# Patient Record
Sex: Female | Born: 1955 | Race: White | Hispanic: No | Marital: Married | Smoking: Never smoker
Health system: Southern US, Community
[De-identification: ages and names within clinical notes are randomized; demographics above are authoritative.]

## PROBLEM LIST (undated history)

## (undated) DIAGNOSIS — M2240 Chondromalacia patellae, unspecified knee: Secondary | ICD-10-CM

## (undated) DIAGNOSIS — R112 Nausea with vomiting, unspecified: Secondary | ICD-10-CM

## (undated) DIAGNOSIS — R51 Headache: Secondary | ICD-10-CM

## (undated) DIAGNOSIS — J189 Pneumonia, unspecified organism: Secondary | ICD-10-CM

## (undated) DIAGNOSIS — Z9889 Other specified postprocedural states: Secondary | ICD-10-CM

## (undated) DIAGNOSIS — M722 Plantar fascial fibromatosis: Secondary | ICD-10-CM

## (undated) DIAGNOSIS — C801 Malignant (primary) neoplasm, unspecified: Secondary | ICD-10-CM

## (undated) DIAGNOSIS — I499 Cardiac arrhythmia, unspecified: Secondary | ICD-10-CM

## (undated) HISTORY — DX: Headache: R51

## (undated) HISTORY — PX: BASAL CELL CARCINOMA EXCISION: SHX1214

## (undated) HISTORY — DX: Plantar fascial fibromatosis: M72.2

## (undated) HISTORY — DX: Chondromalacia patellae, unspecified knee: M22.40

## (undated) HISTORY — PX: TONSILLECTOMY: SUR1361

---

## 1997-12-10 ENCOUNTER — Other Ambulatory Visit: Admission: RE | Admit: 1997-12-10 | Discharge: 1997-12-10 | Payer: Self-pay | Admitting: Obstetrics and Gynecology

## 1999-01-30 ENCOUNTER — Other Ambulatory Visit: Admission: RE | Admit: 1999-01-30 | Discharge: 1999-01-30 | Payer: Self-pay | Admitting: Obstetrics and Gynecology

## 2000-02-10 ENCOUNTER — Observation Stay (HOSPITAL_COMMUNITY): Admission: AD | Admit: 2000-02-10 | Discharge: 2000-02-11 | Payer: Self-pay | Admitting: Cardiology

## 2000-02-10 ENCOUNTER — Encounter: Payer: Self-pay | Admitting: Cardiology

## 2000-02-10 ENCOUNTER — Emergency Department (HOSPITAL_COMMUNITY): Admission: EM | Admit: 2000-02-10 | Discharge: 2000-02-10 | Payer: Self-pay | Admitting: Emergency Medicine

## 2000-02-11 ENCOUNTER — Encounter: Payer: Self-pay | Admitting: Cardiology

## 2000-02-13 ENCOUNTER — Encounter: Payer: Self-pay | Admitting: Cardiology

## 2000-02-13 ENCOUNTER — Encounter: Admission: RE | Admit: 2000-02-13 | Discharge: 2000-02-13 | Payer: Self-pay | Admitting: Cardiology

## 2000-02-19 ENCOUNTER — Encounter: Payer: Self-pay | Admitting: Internal Medicine

## 2000-02-19 ENCOUNTER — Ambulatory Visit (HOSPITAL_COMMUNITY): Admission: RE | Admit: 2000-02-19 | Discharge: 2000-02-19 | Payer: Self-pay | Admitting: Internal Medicine

## 2000-02-25 ENCOUNTER — Ambulatory Visit (HOSPITAL_COMMUNITY): Admission: RE | Admit: 2000-02-25 | Discharge: 2000-02-25 | Payer: Self-pay | Admitting: Internal Medicine

## 2000-03-15 ENCOUNTER — Other Ambulatory Visit: Admission: RE | Admit: 2000-03-15 | Discharge: 2000-03-15 | Payer: Self-pay | Admitting: Obstetrics and Gynecology

## 2000-06-18 ENCOUNTER — Encounter: Payer: Self-pay | Admitting: Internal Medicine

## 2000-06-18 ENCOUNTER — Encounter: Admission: RE | Admit: 2000-06-18 | Discharge: 2000-06-18 | Payer: Self-pay | Admitting: Internal Medicine

## 2001-01-27 ENCOUNTER — Encounter: Admission: RE | Admit: 2001-01-27 | Discharge: 2001-01-27 | Payer: Self-pay | Admitting: Gastroenterology

## 2001-01-27 ENCOUNTER — Encounter: Payer: Self-pay | Admitting: Gastroenterology

## 2001-04-01 ENCOUNTER — Other Ambulatory Visit: Admission: RE | Admit: 2001-04-01 | Discharge: 2001-04-01 | Payer: Self-pay | Admitting: Obstetrics and Gynecology

## 2002-06-02 ENCOUNTER — Other Ambulatory Visit: Admission: RE | Admit: 2002-06-02 | Discharge: 2002-06-02 | Payer: Self-pay | Admitting: Obstetrics and Gynecology

## 2003-06-22 ENCOUNTER — Other Ambulatory Visit: Admission: RE | Admit: 2003-06-22 | Discharge: 2003-06-22 | Payer: Self-pay | Admitting: Obstetrics and Gynecology

## 2003-12-04 ENCOUNTER — Other Ambulatory Visit: Admission: RE | Admit: 2003-12-04 | Discharge: 2003-12-04 | Payer: Self-pay | Admitting: Obstetrics and Gynecology

## 2004-07-15 ENCOUNTER — Other Ambulatory Visit: Admission: RE | Admit: 2004-07-15 | Discharge: 2004-07-15 | Payer: Self-pay | Admitting: Obstetrics and Gynecology

## 2005-02-06 ENCOUNTER — Other Ambulatory Visit: Admission: RE | Admit: 2005-02-06 | Discharge: 2005-02-06 | Payer: Self-pay | Admitting: Obstetrics and Gynecology

## 2005-08-03 ENCOUNTER — Ambulatory Visit: Payer: Self-pay | Admitting: Cardiology

## 2005-08-21 ENCOUNTER — Other Ambulatory Visit: Admission: RE | Admit: 2005-08-21 | Discharge: 2005-08-21 | Payer: Self-pay | Admitting: Obstetrics and Gynecology

## 2006-08-20 ENCOUNTER — Ambulatory Visit: Payer: Self-pay | Admitting: Cardiology

## 2006-09-29 ENCOUNTER — Ambulatory Visit (HOSPITAL_COMMUNITY): Admission: RE | Admit: 2006-09-29 | Discharge: 2006-09-29 | Payer: Self-pay | Admitting: Obstetrics and Gynecology

## 2007-11-02 ENCOUNTER — Ambulatory Visit: Payer: Self-pay | Admitting: Cardiology

## 2010-10-07 NOTE — Assessment & Plan Note (Signed)
[facility]                            CARDIOLOGY OFFICE NOTE   NAME:[name], [name] HOSTERMAN                         MRN:          [id number]  DATE:11/02/2007                            DOB:          03-07-1956    PRIMARY CARE PHYSICIAN:  [name] [name], M.D.   REASON FOR VISIT:  Follow up palpitations.   HISTORY OF PRESENT ILLNESS:  [name] [name] comes in for a 1-year visit.  She  has a history of palpitations and possible paroxysmal supraventricular  tachycardia versus inappropriate sinus tachycardia that was evaluated in  the past.  She has been very stable on beta blocker therapy without any  prolonged rapid palpitations.  She does have occasional palpitations but  they do not trouble her.  Her electrocardiogram today is normal, showing  sinus rhythm at 79 beats per minute with normal intervals.  She actually  cut her Toprol XL back from 50 mg a day to 25 mg a day and has tolerated  this intervention.  She reports some weight gain and has had some knee  discomfort which has limited her activities, although she is still able  walk three miles a day.   ALLERGIES:  AMPICILLIN.   MEDICATIONS:  Toprol XL 25 mg p.o. daily.   REVIEW OF SYSTEMS:  As described in the history of present illness.  Otherwise negative.   EXAMINATION:  Blood pressure 104/64, heart rate is 80.  Weight is 162  pounds.  The patient is comfortable, in no acute distress.  NECK:  No elevated jugular venous pressure, no loud bruits.  No  thyromegaly.  LUNGS:  Clear without labored breathing.  CARDIAC:  A regular rate and rhythm.  No loud murmur or gallop.  EXTREMITIES:  No pitting edema.   IMPRESSION AND RECOMMENDATIONS:  History of palpitations and possible  paroxysmal supraventricular tachycardia.  [name] [name] has been very stable  on medical therapy and at this point we will make cardiology follow-up  on an as-needed basis.  She will plan to continue to see [name] [name], who  can  assume her Toprol XL prescription, which I would keep at 25 mg daily  for now.  Clearly, if she develops progressive symptoms I would suggest  an event recorder to document her arrhythmia and then referral back  perhaps to our electrophysiology team to discuss additional options.     [name] Sidle, MD  Electronically Signed   SGM/MedQ  DD: 11/02/2007  DT: 11/02/2007  Job #: [id number]   cc:   [name] [name], M.D.

## 2010-10-10 NOTE — Discharge Summary (Signed)
[place]. [facility]  Patient:    [name] [name], [name] [name]                           MRN: [id number] Adm. Date:  [id number] Disc. Date: 02/11/00 Attending:  Learta Codding Dictator:   Delton See, P.A. CC:         Lewayne Bunting, M.D.                           Discharge Summary  DATE OF BIRTH:  10-15-1955  HISTORY OF PRESENT ILLNESS:  Ms. Kuhar is a 55 year old female with no significant past medical history.  She was seen in the emergency room at [facility] at approximately 1 a.m. when she presented with sudden onset of left-sided arm pain and palpitations.  She reported sudden onset of the palpitations and presyncope at approximately 1 a.m.  An EKG was obtained which showed no evidence of an MI, however, the patient reported that her heart rate was occasionally in the 150 range and she nearly had a syncopal episode in the emergency room.  She was given IV fluids and eventually discharged with arrangements made to follow up in the office with Dr. Andee Lineman.  PAST MEDICAL HISTORY:  The patient had a basal cell carcinoma three years ago. She has had vaginal deliveries in 1985 and 1983.  She had a tonsillectomy 7 years ago.  ALLERGIES:  AMPICILLIN, CODEINE.  SOCIAL HISTORY:  The patient does not smoke.  She does not use alcohol.  She is currently in grad school.  She used to be a Runner, broadcasting/film/video.  She does aerobics and exercises regularly.  She is married and has two children who are healthy.  FAMILY HISTORY:  Her father had coronary artery bypass graft surgery in his 48s.  Her mother is age 81 and is alive and healthy.  She has one brother at age 42 who is healthy.  HOSPITAL COURSE:  As noted, this patient was seen in the emergency room with palpitations and persyncope.  She was later seen in the office by Dr. Andee Lineman and arrangements were made to admit the patient for further evaluation. Cardiac enzymes were performed.  The patient ruled out for an MI.   Cardiac enzymes were negative and a D-dimer was also negative.  The patient was scheduled for an Adenosine Cardiolite which was performed on February 11, 2000.  This was negative for ischemia and showed an ejection fraction greater than 60%.  During the Adenosine Cardiolite, the patient became very anxious and had a sinus tachycardia in the 140 range.  Her rate returned to the 90 range following the study.  During the patients stay, she was treated with metoprolol 12.5 mg b.i.d. and she was on aspirin.  Upon further discussion with Dr. Andee Lineman a decision was made to send her out on Toprol XL but no aspirin.  Initially she was to go home on Toprol XL 50 mg daily, however, her blood pressure was approximately 90 at the time of discharge and her dosage was reduced to 25 mg daily.  She was discharged in improved condition on February 11, 2000.  DISCHARGE MEDICATIONS:  Toprol XL 25 mg daily.  ACTIVITY:  As tolerated.  She was to resume the same diet she was on before. It was felt that she might need an event monitor if she continued to have symptomatic palpitations or tachycardia.  She was told to call the office in this event and an event monitor could be scheduled.  She would see Dr. Andee Lineman in the office on October 12, at 12 noon.  She does not have a primary physician.  It was recommended that she find a family physician.  She was told she could follow with [facility] and she was told to call for an appointment if this was her decision.  DISCHARGE DIAGNOSES: 1. Tachycardia with left arm pain, uncertain etiology. 2. Adenosine Cardiolite negative for ischemia, ejection fraction greater    than 60%. 3. Anxiety. 4. Sinus tachycardia, rates in the 140 range. 5. Mild asymptomatic hypotension. 6. History of basal cell carcinoma. 7. History of vaginal deliveries x 2. 8. Status post tonsillectomy. 9. Intolerant to ampicillin and Codeine. DD:  02/11/00 TD:  02/11/00 Job:  [id number]

## 2010-10-10 NOTE — H&P (Signed)
[place]. [facility]  Patient:    [name] [name], [name] [name]                           MRN: [id number] Adm. Date:  [id number] Attending:  Learta Codding CC:         Trudi Ida. Denton Lank, M.D.   History and Physical  REASON FOR ADMISSION:  Substernal chest pain and left sided arm pain associated with palpitations and presyncopal.  HISTORY OF PRESENT ILLNESS:  The patient is a 55 year old white female with no significant past medical history. She was seen in the emergency room at 1:00 this morning after she presented with the sudden onset of left sided arm pain and palpitations. The patient reported the sudden onset of palpitations and presyncopal at 1:00 this morning. She also felt some mild shortness of breath. She felt a numbness radiating down the left arm. She also noticed her heart rate was very fast, although she could not tell us exactly how fast. She then presented to the emergency room where she was placed on telemetry and a 12 lead electrocardiogram was obtained. There was no evidence of myocardial injury. However, the patient reports that one one occasion her heart rate went up in the 150s and she nearly blacked out in the emergency room. At that time they placed intravenous, but it is not entirely clear what else was done. I do have an electrocardiogram from the emergency room which shows sinus tachycardia, but no other acute changes. The patient, in addition to palpitations, reported some left shoulder and left jaw pain.  She has no prior history of syncope of palpitations. She has been in her usual state of health and she has a good exercise tolerance otherwise. She is NYH Class 1. She denied any recent fevers or chills.  PAST MEDICAL HISTORY: 1. Basal cell carcinoma three years ago. 2. Vaginal delivery in 1985 and 1983. 3. Tonsillectomy seven years ago.  ALLERGIES:  AMPICILLIN causes rash. CODEINE causes nausea.  SOCIAL HISTORY:  The patient does not smoke.  She does not drink alcohol. She is currently in grad school. 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 She has two children that are healthy.  FAMILY HISTORY:  She has a father who had bypass surgery in his 62s. Mother at age 56 is alive and healthy. One brother is age 26 and is healthy.  REVIEW OF SYSTEMS:  As per HPI.  PHYSICAL EXAMINATION:  VITAL SIGNS:  Blood pressure 110/80, heart rate 103 beats per minute. Temperature in the emergency room was afebrile.  GENERAL:  Well-nourished white female in no apparent distress.  HEENT:  No JVD, abdominal jugular reflex, normal carotid upstroke. No carotid bruits.  NECK:  Supple.  LUNGS:  Clear. Breath sounds bilaterally.  HEART:  Regular rate and rhythm. Normal S1 and S2. Tachycardic. PMI is non-displaced.  ABDOMEN:  Soft, nontender. No rebound or guarding.  EXTREMITIES:  2+ peripheral pulses. No cyanosis, clubbing or edema.  12 lead electrocardiogram normal sinus rhythm, normal intervals, no acute ischemic changes.  LABORATORY DATA:  This was obtained in the emergency room. Blood gas pH 7.45, PCO2 32. Hemoglobin 13, hematocrit 39. Sodium 142, potassium 3.4, chloride 108, glucose 127, BUN 10. Initial CK, CK MB within normal limits with a troponin less than 0.03.  IMPRESSION AND PLAN: 1. Palpitations. The patient likely has some type of supraventricular    arrhythmia, although  this has not been documented yet. Two    electrocardiograms that I have in my possession shows basically sinus    tachycardia. There is no history of thyroid disease, but this will be ruled    out. However, I am a little bit concerned about pulmonary embolism causing    sinus tachycardia particularly with the patients presyncopal. A D-dimer    will be obtained and if positive the patient will need further evaluation    with ventilation perfusion scan or spiral CT scan. In the event her workup    in the hospital is  negative she will need an event loop monitor upon    discharge. 2. Left arm pain and left shoulder pain. Although the patient has very few    risk factors for coronary artery disease due to ongoing pain and the fact    that her initial troponin was obtained within six hours of her pain onset    we will obtain more serial cardiac enzymes. The patient will be admitted    for rule out myocardial infarction protocol. If her enzymes are negative    she will be scheduled for an Adenosine Cardiolite to be done tomorrow. 3. Disposition. I have discussed with the patient at great length the need    for her hospitalization. She understands and the plan is to rule out    pulmonary embolism and myocardial infarction during this admission. DD:  02/10/00 TD:  02/10/00 Job: [id number]

## 2010-10-10 NOTE — Assessment & Plan Note (Signed)
[place] [facility]                            CARDIOLOGY OFFICE NOTE   NAME:Fessenden, [name] [name]                         MRN:          [id number]  DATE:08/20/2006                            DOB:          [birth year]    PRIMARY CARE PHYSICIAN:  Dr. Johnella Moloney   REASON FOR VISIT:  Routine followup.   HISTORY OF PRESENT ILLNESS:  I saw [name] [name] back in March 2007.  She  has a history of palpitations and possibly paroxysmal supraventricular  tachycardia versus inappropriate sinus tachycardia as evaluated  previously.  She has done very well on beta blocker therapy which we  have not had to adjust.  She reports no major progression in symptoms  since last year.  Her electrocardiogram today is normal, showing sinus  rhythm at 74 beats per minute.  She reports that occasionally she feels  as if her throat closes up.  This is nonexertional and sporadic in  nature.  She reports no dysphagia or odynophagia.  She has had no  dizziness or syncope.   ALLERGIES:  AMPICILLIN.   PRESENT MEDICATIONS:  1. Toprol XL 50 mg p.o. daily.  2. Relpax p.r.n.   REVIEW OF SYSTEMS:  As described in history of present illness.  Otherwise, negative.   EXAMINATION:  VITAL SIGNS:  Blood pressure today is 100/72; heart rate  is 74; weight is 154 pounds.  GENERAL:  The patient is comfortable and in no acute distress.  HEENT:  Conjunctivae and lids are normal.  Oropharynx is clear.  NECK:  Supple.  No thyromegaly.  No masses appreciated.  No carotid  bruits are present.  Normal carotid upstrokes.  LUNGS:  Clear to auscultation.  CARDIAC:  Regular rate and rhythm.  No S3 gallop or loud murmur.  EXTREMITIES:  No pitting edema.   IMPRESSION AND RECOMMENDATIONS:  1. History of palpitations and possible paroxysmal supraventricular      tachycardia.  The patient is well controlled on beta blocker      therapy which we will continue.  I will have her follow up in 1      year's time for  further review.  2. Otherwise, continue regular followup with Dr. Kevan Ny.     Jonelle Sidle, MD  Electronically Signed   SGM/MedQ  DD: 08/20/2006  DT: 08/20/2006  Job #: [id number]

## 2012-04-22 ENCOUNTER — Ambulatory Visit: Payer: BC Managed Care – PPO | Admitting: Physician Assistant

## 2012-04-22 VITALS — BP 124/76 | HR 76 | Temp 98.2°F | Resp 17 | Ht 63.5 in | Wt 135.0 lb

## 2012-04-22 DIAGNOSIS — H1089 Other conjunctivitis: Secondary | ICD-10-CM

## 2012-04-22 DIAGNOSIS — J329 Chronic sinusitis, unspecified: Secondary | ICD-10-CM

## 2012-04-22 DIAGNOSIS — H109 Unspecified conjunctivitis: Secondary | ICD-10-CM

## 2012-04-22 MED ORDER — POLYMYXIN B-TRIMETHOPRIM 10000-0.1 UNIT/ML-% OP SOLN: 1.0000 [drp] | OPHTHALMIC | Status: DC

## 2012-04-22 MED ORDER — IPRATROPIUM BROMIDE 0.03 % NA SOLN: 2.0000 | Freq: Two times a day (BID) | NASAL | Status: DC

## 2012-04-22 MED ORDER — AZITHROMYCIN 250 MG PO TABS: ORAL_TABLET | ORAL | Status: DC

## 2012-04-22 NOTE — Progress Notes (Signed)
  Subjective:    Patient ID: [name] [name], female    DOB: February 05, 1956, 56 y.o.   MRN: [id number]  HPI 56 year old female presents with 1 week history of nasal congestion, slight cough, sinus pressure, postnasal drip, and ear fullness.  States she has felt "sick" for the last few days, but has been taking Advil which helps. No fevers, chills, nausea, vomiting, or abdominal pain.  Admits that this morning she woke up with eye's stuck shut and had thick purulent drainage.  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  She is otherwise healthy with no other complaints today.     Review of Systems  Constitutional: Negative for fever and chills.  HENT: Positive for congestion, rhinorrhea, postnasal drip and sinus pressure. Negative for sore throat and neck pain.   Eyes: Positive for discharge and redness.  Respiratory: Positive for cough (minimal). Negative for chest tightness, shortness of breath and wheezing.   Cardiovascular: Negative for chest pain.  All other systems reviewed and are negative.       Objective:   Physical Exam  Constitutional: She is oriented to person, place, and time. She appears well-developed and well-nourished.  HENT:  Head: Normocephalic and atraumatic.  Right Ear: Hearing, tympanic membrane, external ear and ear canal normal.  Left Ear: Hearing, tympanic membrane, external ear and ear canal normal.  Mouth/Throat: Uvula is midline, oropharynx is clear and moist and mucous membranes are normal. No oropharyngeal exudate.  Eyes: EOM are normal. Pupils are equal, round, and reactive to light. Right eye exhibits discharge. Left eye exhibits discharge. Right conjunctiva is injected. Left conjunctiva is injected.  Neck: Normal range of motion.  Cardiovascular: Normal rate, regular rhythm and normal heart sounds.   Pulmonary/Chest: Effort normal and breath sounds normal.  Lymphadenopathy:    She has no cervical adenopathy.  Neurological: She  is alert and oriented to person, place, and time.  Psychiatric: She has a normal mood and affect. Her behavior is normal. Judgment and thought content normal.          Assessment & Plan:   1. Bacterial conjunctivitis  trimethoprim-polymyxin b (POLYTRIM) ophthalmic solution  2. Sinusitis  ipratropium (ATROVENT) 0.03 % nasal spray, azithromycin (ZITHROMAX) 250 MG tablet   Start Zpack and polytrim eye drops Atrovent NS bid to help with congestion Increase fluids and rest Follow up if symptoms worsen or fail to improve.

## 2012-06-15 ENCOUNTER — Ambulatory Visit (INDEPENDENT_AMBULATORY_CARE_PROVIDER_SITE_OTHER): Payer: BC Managed Care – PPO | Admitting: Family Medicine

## 2012-06-15 ENCOUNTER — Ambulatory Visit
Admission: RE | Admit: 2012-06-15 | Discharge: 2012-06-15 | Disposition: A | Discharge status: Home / Self Care | Payer: BC Managed Care – PPO | Source: Ambulatory Visit | Attending: Family Medicine | Admitting: Family Medicine

## 2012-06-15 VITALS — BP 136/69 | Ht 64.0 in | Wt 130.0 lb

## 2012-06-15 DIAGNOSIS — M25461 Effusion, right knee: Secondary | ICD-10-CM

## 2012-06-15 DIAGNOSIS — M25469 Effusion, unspecified knee: Secondary | ICD-10-CM

## 2012-06-15 NOTE — Patient Instructions (Addendum)
Thank you for coming in today.   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  We will also try a compression sleeve.  Come back in 1-4 weeks to discuss you xray and consider draining and injecting you knee.

## 2012-06-16 NOTE — Assessment & Plan Note (Signed)
Discussed options:  I offered aspiration and drainage however patient elected to wait at this time.  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  Additionally will provide knee sleeve. Instructed patient to try to do her walking on soft surfaces such as trials. She'll followup in a few weeks for consideration of aspiration and drainage.

## 2012-06-16 NOTE — Progress Notes (Signed)
[name] [name] is a 57 y.o. female who presents to [facility] today for right knee pain and swelling.  This started in October after the patient increased her running to 3 miles daily. She cannot recall any injury. She notes pain and swelling of her right knee especially following activity. She can walk about 1 mile before she has significant pain and swelling.  She has been working with a physical therapist in the interim and has developed a good core strength and quadriceps strength however she continues to have knee pain and swelling. She denies any radiating pain weakness or numbness.  She has tried some over-the-counter pain medications which are somewhat helpful.  She notes that the pain is in the anterior aspect of her knee.     PMH reviewed. Healthy otherwise no history of knee injury History  Substance Use Topics  . Smoking status: Never Smoker   . Smokeless tobacco: Not on file  . Alcohol Use: No   ROS as above otherwise neg   Exam:  BP 136/69  Ht 5\' 4"  (1.626 m)  Wt 130 lb (58.968 kg)  BMI 22.31 kg/m2 Gen: Well NAD MSK: Right knee. Moderate effusion range of motion 0-120 1+ at patellar crepitations on extension nontender over the medial and lateral joint lines negative McMurray's test negative valgus varus stress negative Lachman's.   Contralateral left knee shows normal range of motion with no effusion stable ligamentous exam.   Distal pulses capillary refill and sensation are intact bilaterally extremity.  Hip abductor strength is 5/5 bilaterally  Dg Knee Complete 4 Views Right  06/15/2012  *RADIOLOGY REPORT*  Clinical Data: Popping left knee the  RIGHT KNEE - COMPLETE 4+ VIEW  Comparison: None.  Findings: No fracture or dislocation of the right knee.  No joint effusion.  Flabella noted posterior to the joint.  IMPRESSION: No acute osseous abnormality.   Original Report Authenticated By: Genevive Bi, M.D.

## 2012-06-29 ENCOUNTER — Ambulatory Visit: Payer: BC Managed Care – PPO | Admitting: Family Medicine

## 2013-11-30 ENCOUNTER — Other Ambulatory Visit: Payer: Self-pay | Admitting: Internal Medicine

## 2013-11-30 DIAGNOSIS — R51 Headache: Secondary | ICD-10-CM

## 2013-12-05 ENCOUNTER — Ambulatory Visit
Admission: RE | Admit: 2013-12-05 | Discharge: 2013-12-05 | Disposition: A | Discharge status: Home / Self Care | Payer: BC Managed Care – PPO | Source: Ambulatory Visit | Attending: Internal Medicine | Admitting: Internal Medicine

## 2013-12-05 DIAGNOSIS — R51 Headache: Secondary | ICD-10-CM

## 2013-12-05 MED ORDER — GADOBENATE DIMEGLUMINE 529 MG/ML IV SOLN
12.0000 mL | Freq: Once | INTRAVENOUS | Status: AC | PRN
  Administered 2013-12-05: 12 mL via INTRAVENOUS

## 2013-12-13 ENCOUNTER — Ambulatory Visit: Payer: BC Managed Care – PPO | Admitting: Neurology

## 2013-12-26 ENCOUNTER — Ambulatory Visit: Payer: BC Managed Care – PPO | Admitting: Neurology

## 2013-12-26 ENCOUNTER — Ambulatory Visit (INDEPENDENT_AMBULATORY_CARE_PROVIDER_SITE_OTHER): Payer: BC Managed Care – PPO | Admitting: Neurology

## 2013-12-26 ENCOUNTER — Encounter: Payer: Self-pay | Admitting: Neurology

## 2013-12-26 VITALS — BP 101/61 | HR 79 | Ht 63.0 in | Wt 128.3 lb

## 2013-12-26 DIAGNOSIS — R51 Headache: Secondary | ICD-10-CM

## 2013-12-26 DIAGNOSIS — G43009 Migraine without aura, not intractable, without status migrainosus: Secondary | ICD-10-CM | POA: Insufficient documentation

## 2013-12-26 DIAGNOSIS — G4484 Primary exertional headache: Secondary | ICD-10-CM

## 2013-12-26 MED ORDER — ELETRIPTAN HYDROBROMIDE 40 MG PO TABS: 40.0000 mg | ORAL_TABLET | ORAL | Status: DC | PRN

## 2013-12-26 NOTE — Patient Instructions (Signed)
I had a long discussion with the patient regarding her exertional headaches which I feel are a variant of migraines. I personally reviewed her recent MRI and MRA films and discussed results with the patient I recommend she try taking ibuprofen 400 mg prior to her isometric exercises like lifting weights and if she still gets a headache then take the Relpax at the onset. Try to limit this to less than 2 days a week. Avoid her usual migraine triggers and use ibuprofen or Relpax as needed for symptomatic relief. No further neurological testing is indicated at the present time. Return for followup in 3 months with Charlott Holler, NP or call earlier if necessary.  Migraine Headache A migraine headache is an intense, throbbing pain on one or both sides of your head. A migraine can last for 30 minutes to several hours. CAUSES  The exact cause of a migraine headache is not always known. However, a migraine may be caused when nerves in the brain become irritated and release chemicals that cause inflammation. This causes pain. Certain things may also trigger migraines, such as:  Alcohol.  Smoking.  Stress.  Menstruation.  Aged cheeses.  Foods or drinks that contain nitrates, glutamate, aspartame, or tyramine.  Lack of sleep.  Chocolate.  Caffeine.  Hunger.  Physical exertion.  Fatigue.  Medicines used to treat chest pain (nitroglycerine), birth control pills, estrogen, and some blood pressure medicines. SIGNS AND SYMPTOMS  Pain on one or both sides of your head.  Pulsating or throbbing pain.  Severe pain that prevents daily activities.  Pain that is aggravated by any physical activity.  Nausea, vomiting, or both.  Dizziness.  Pain with exposure to bright lights, loud noises, or activity.  General sensitivity to bright lights, loud noises, or smells. Before you get a migraine, you may get warning signs that a migraine is coming (aura). An aura may include:  Seeing flashing  lights.  Seeing bright spots, halos, or zigzag lines.  Having tunnel vision or blurred vision.  Having feelings of numbness or tingling.  Having trouble talking.  Having muscle weakness. DIAGNOSIS  A migraine headache is often diagnosed based on:  Symptoms.  Physical exam.  A CT scan or MRI of your head. These imaging tests cannot diagnose migraines, but they can help rule out other causes of headaches. TREATMENT Medicines may be given for pain and nausea. Medicines can also be given to help prevent recurrent migraines.  HOME CARE INSTRUCTIONS  Only take over-the-counter or prescription medicines for pain or discomfort as directed by your health care provider. The use of long-term narcotics is not recommended.  Lie down in a dark, quiet room when you have a migraine.  Keep a journal to find out what may trigger your migraine headaches. For example, write down:  What you eat and drink.  How much sleep you get.  Any change to your diet or medicines.  Limit alcohol consumption.  Quit smoking if you smoke.  Get 7-9 hours of sleep, or as recommended by your health care provider.  Limit stress.  Keep lights dim if bright lights bother you and make your migraines worse. SEEK IMMEDIATE MEDICAL CARE IF:   Your migraine becomes severe.  You have a fever.  You have a stiff neck.  You have vision loss.  You have muscular weakness or loss of muscle control.  You start losing your balance or have trouble walking.  You feel faint or pass out.  You have severe symptoms that are  different from your first symptoms. MAKE SURE YOU:   Understand these instructions.  Will watch your condition.  Will get help right away if you are not doing well or get worse. Document Released: 05/11/2005 Document Revised: 09/25/2013 Document Reviewed: 01/16/2013 [facility] Patient Information 2015 [place], [place]. This information is not intended to replace advice given to you by your  health care provider. Make sure you discuss any questions you have with your health care provider.

## 2013-12-26 NOTE — Progress Notes (Signed)
[facility] [address] [place]. [place] [address] [phone]       OFFICE CONSULT NOTE  Ms. FREDDI FORSTER Date of Birth:  Sep 24, 1955 Medical Record Number:  [id number]   Referring MD:  Dwaine Deter  Reason for Referral:  Exertional headaches  HPI: 6 year Caucasian lady who has a long-standing history of migraine headaches since teenage years which usually responds well to Relpax and occur  at her baseline frequency of once a month or so. Since the last 2-3 months she has had the change in headache patterns and is getting mainly exertional headaches. Specifically the headaches occur when she is lifting weights or doing pushups but interestingly does not occur when she is walking or doing aerobics or jogging. This has occurred almost every time she is lifting weights or pushups. She has has tried taking Relpax for her ibuprofen which seems to help. She denies any visual symptoms or headaches. The headaches are severe and exploding 10 out of 10 with nausea but no light or sound sensitivity. She denies any focal neurological symptoms accompanying her headaches. She denies any visual symptoms along with headaches the she did have some vision symptoms with some of her remote migraines. She has no history of strokes, TIAs or other significant heart problems. She underwent MRI scan of the brain and MRA of the brain on 12/05/48 and which I have personally reviewed shows only minor nonspecific changes of chronic microvascular ischemia an MRA shows only mild right distal MCA stenosis but without any aneurysms or large vessel stenosis.  ROS:   14 system review of systems is positive for headache, joint pain, joint swelling, aching muscles, skin moles and all the systems negative  PMH:  Past Medical History  Diagnosis Date  . Headache(784.0)   . Plantar fasciitis   . Chondromalacia of patella     Social History:  History   Social History  . Marital Status: Married   Spouse Name: N/A    Number of Children: 2  . Years of Education: MA   Occupational History  . [employer]    Social History Main Topics  . Smoking status: Never Smoker   . Smokeless tobacco: Not on file  . Alcohol Use: No  . Drug Use: No  . Sexual Activity: Yes    Birth Control/ Protection: None   Other Topics Concern  . Not on file   Social History Narrative   Patient lives at home with her family   Patient is right handed   Patient drink coffee daily    Medications:   Current Outpatient Prescriptions on File Prior to Visit  Medication Sig Dispense Refill  . metoprolol succinate (TOPROL-XL) 25 MG 24 hr tablet Take 25 mg by mouth daily.       No current facility-administered medications on file prior to visit.    Allergies:   Allergies  Allergen Reactions  . Ampicillin Rash    Physical Exam General: well developed, well nourished, seated, in no evident distress Head: head normocephalic and atraumatic. Orohparynx benign Neck: supple with no carotid or supraclavicular bruits Cardiovascular: regular rate and rhythm, no murmurs Musculoskeletal: no deformity Skin:  no rash/petichiae Vascular:  Normal pulses all extremities Filed Vitals:   12/26/13 1355  BP: 101/61  Pulse: 79    Neurologic Exam Mental Status: Awake and fully alert. Oriented to place and time. Recent and remote memory intact. Attention span, concentration and fund of knowledge appropriate. Mood and affect appropriate.  Cranial  Nerves: Fundoscopic exam reveals sharp disc margins. Pupils equal, briskly reactive to light. Extraocular movements full without nystagmus. Visual fields full to confrontation. Hearing intact. Facial sensation intact. Face, tongue, palate moves normally and symmetrically.  Motor: Normal bulk and tone. Normal strength in all tested extremity muscles. Sensory.: intact to tough and pinprick and vibratory sensation.  Coordination: Rapid alternating movements normal in all extremities.  Finger-to-nose and heel-to-shin performed accurately bilaterally. Gait and Station: Arises from chair without difficulty. Stance is normal. Gait demonstrates normal stride length and balance . Able to heel, toe and tandem walk without difficulty.  Reflexes: 1+ and symmetric. Toes downgoing.      ASSESSMENT: 77 year Caucasian lady with a long-standing history of migraine headaches with recent exertional headaches for the last 23 months which also seem migrainous in nature.    PLAN: I had a long discussion with the patient regarding her exertional headaches which I feel are a variant of migraines. I personally reviewed her recent MRI and MRA films and discussed results with the patient I recommend she try taking ibuprofen 400 mg prior to her isometric exercises like lifting weights and if she still gets a headache then take the Relpax at the onset. Try to limit this to less than 2 days a week. Avoid her usual migraine triggers and use ibuprofen or Relpax as needed for symptomatic relief. No further neurological testing is indicated at the present time. Return for followup in 3 months with Charlott Holler, NP or call earlier if necessary.    Note: This document was prepared with digital dictation and possible smart phrase technology. Any transcriptional errors that result from this process are unintentional.

## 2014-04-03 ENCOUNTER — Ambulatory Visit: Payer: BC Managed Care – PPO | Admitting: Nurse Practitioner

## 2014-08-28 ENCOUNTER — Other Ambulatory Visit: Payer: Self-pay | Admitting: Internal Medicine

## 2014-08-28 DIAGNOSIS — R1032 Left lower quadrant pain: Secondary | ICD-10-CM

## 2014-08-29 ENCOUNTER — Other Ambulatory Visit: Payer: Self-pay

## 2014-08-30 ENCOUNTER — Ambulatory Visit
Admission: RE | Admit: 2014-08-30 | Discharge: 2014-08-30 | Disposition: A | Discharge status: Home / Self Care | Payer: Self-pay | Source: Ambulatory Visit | Attending: Internal Medicine | Admitting: Internal Medicine

## 2014-08-30 DIAGNOSIS — R1032 Left lower quadrant pain: Secondary | ICD-10-CM

## 2014-08-30 MED ORDER — IOPAMIDOL (ISOVUE-300) INJECTION 61%
100.0000 mL | Freq: Once | INTRAVENOUS | Status: AC | PRN
  Administered 2014-08-30: 100 mL via INTRAVENOUS

## 2015-09-30 DIAGNOSIS — D225 Melanocytic nevi of trunk: Secondary | ICD-10-CM | POA: Diagnosis not present

## 2015-09-30 DIAGNOSIS — L821 Other seborrheic keratosis: Secondary | ICD-10-CM | POA: Diagnosis not present

## 2015-09-30 DIAGNOSIS — L812 Freckles: Secondary | ICD-10-CM | POA: Diagnosis not present

## 2015-09-30 DIAGNOSIS — D1801 Hemangioma of skin and subcutaneous tissue: Secondary | ICD-10-CM | POA: Diagnosis not present

## 2015-11-05 DIAGNOSIS — H524 Presbyopia: Secondary | ICD-10-CM | POA: Diagnosis not present

## 2016-01-06 DIAGNOSIS — Z0000 Encounter for general adult medical examination without abnormal findings: Secondary | ICD-10-CM | POA: Diagnosis not present

## 2016-01-06 DIAGNOSIS — Z23 Encounter for immunization: Secondary | ICD-10-CM | POA: Diagnosis not present

## 2016-01-06 DIAGNOSIS — R1032 Left lower quadrant pain: Secondary | ICD-10-CM | POA: Diagnosis not present

## 2016-01-06 DIAGNOSIS — E559 Vitamin D deficiency, unspecified: Secondary | ICD-10-CM | POA: Diagnosis not present

## 2016-02-05 DIAGNOSIS — L309 Dermatitis, unspecified: Secondary | ICD-10-CM | POA: Diagnosis not present

## 2016-02-25 DIAGNOSIS — R194 Change in bowel habit: Secondary | ICD-10-CM | POA: Diagnosis not present

## 2016-02-25 DIAGNOSIS — R1032 Left lower quadrant pain: Secondary | ICD-10-CM | POA: Diagnosis not present

## 2016-02-28 DIAGNOSIS — B350 Tinea barbae and tinea capitis: Secondary | ICD-10-CM | POA: Diagnosis not present

## 2016-03-13 DIAGNOSIS — Z23 Encounter for immunization: Secondary | ICD-10-CM | POA: Diagnosis not present

## 2016-04-08 DIAGNOSIS — R194 Change in bowel habit: Secondary | ICD-10-CM | POA: Diagnosis not present

## 2016-04-08 DIAGNOSIS — K5730 Diverticulosis of large intestine without perforation or abscess without bleeding: Secondary | ICD-10-CM | POA: Diagnosis not present

## 2016-04-08 DIAGNOSIS — K640 First degree hemorrhoids: Secondary | ICD-10-CM | POA: Diagnosis not present

## 2016-04-08 DIAGNOSIS — R1032 Left lower quadrant pain: Secondary | ICD-10-CM | POA: Diagnosis not present

## 2016-04-20 DIAGNOSIS — Z01419 Encounter for gynecological examination (general) (routine) without abnormal findings: Secondary | ICD-10-CM | POA: Diagnosis not present

## 2016-04-20 DIAGNOSIS — Z1231 Encounter for screening mammogram for malignant neoplasm of breast: Secondary | ICD-10-CM | POA: Diagnosis not present

## 2016-04-20 DIAGNOSIS — Z6824 Body mass index (BMI) 24.0-24.9, adult: Secondary | ICD-10-CM | POA: Diagnosis not present

## 2016-04-29 DIAGNOSIS — R1032 Left lower quadrant pain: Secondary | ICD-10-CM | POA: Diagnosis not present

## 2016-12-28 DIAGNOSIS — H5203 Hypermetropia, bilateral: Secondary | ICD-10-CM | POA: Diagnosis not present

## 2017-01-06 DIAGNOSIS — H40033 Anatomical narrow angle, bilateral: Secondary | ICD-10-CM | POA: Diagnosis not present

## 2017-01-11 DIAGNOSIS — Z0000 Encounter for general adult medical examination without abnormal findings: Secondary | ICD-10-CM | POA: Diagnosis not present

## 2017-04-27 DIAGNOSIS — Z1231 Encounter for screening mammogram for malignant neoplasm of breast: Secondary | ICD-10-CM | POA: Diagnosis not present

## 2017-04-27 DIAGNOSIS — Z01419 Encounter for gynecological examination (general) (routine) without abnormal findings: Secondary | ICD-10-CM | POA: Diagnosis not present

## 2017-04-27 DIAGNOSIS — Z6825 Body mass index (BMI) 25.0-25.9, adult: Secondary | ICD-10-CM | POA: Diagnosis not present

## 2017-04-28 ENCOUNTER — Other Ambulatory Visit: Payer: Self-pay | Admitting: Obstetrics and Gynecology

## 2017-04-28 DIAGNOSIS — N6310 Unspecified lump in the right breast, unspecified quadrant: Secondary | ICD-10-CM

## 2017-04-30 ENCOUNTER — Ambulatory Visit
Admission: RE | Admit: 2017-04-30 | Discharge: 2017-04-30 | Disposition: A | Discharge status: Home / Self Care | Payer: BLUE CROSS/BLUE SHIELD | Source: Ambulatory Visit | Attending: Obstetrics and Gynecology | Admitting: Obstetrics and Gynecology

## 2017-04-30 ENCOUNTER — Other Ambulatory Visit: Payer: Self-pay | Admitting: Obstetrics and Gynecology

## 2017-04-30 DIAGNOSIS — N6310 Unspecified lump in the right breast, unspecified quadrant: Secondary | ICD-10-CM | POA: Diagnosis not present

## 2017-04-30 DIAGNOSIS — R922 Inconclusive mammogram: Secondary | ICD-10-CM | POA: Diagnosis not present

## 2017-05-26 DIAGNOSIS — N63 Unspecified lump in unspecified breast: Secondary | ICD-10-CM | POA: Diagnosis not present

## 2017-11-15 DIAGNOSIS — H43813 Vitreous degeneration, bilateral: Secondary | ICD-10-CM | POA: Diagnosis not present

## 2018-01-12 DIAGNOSIS — G43909 Migraine, unspecified, not intractable, without status migrainosus: Secondary | ICD-10-CM | POA: Diagnosis not present

## 2018-01-12 DIAGNOSIS — I479 Paroxysmal tachycardia, unspecified: Secondary | ICD-10-CM | POA: Diagnosis not present

## 2018-01-12 DIAGNOSIS — Q6100 Congenital renal cyst, unspecified: Secondary | ICD-10-CM | POA: Diagnosis not present

## 2018-01-12 DIAGNOSIS — E559 Vitamin D deficiency, unspecified: Secondary | ICD-10-CM | POA: Diagnosis not present

## 2018-01-12 DIAGNOSIS — Z0000 Encounter for general adult medical examination without abnormal findings: Secondary | ICD-10-CM | POA: Diagnosis not present

## 2018-02-07 DIAGNOSIS — H524 Presbyopia: Secondary | ICD-10-CM | POA: Diagnosis not present

## 2018-02-10 DIAGNOSIS — L814 Other melanin hyperpigmentation: Secondary | ICD-10-CM | POA: Diagnosis not present

## 2018-02-10 DIAGNOSIS — L308 Other specified dermatitis: Secondary | ICD-10-CM | POA: Diagnosis not present

## 2018-02-10 DIAGNOSIS — R202 Paresthesia of skin: Secondary | ICD-10-CM | POA: Diagnosis not present

## 2018-02-10 DIAGNOSIS — D225 Melanocytic nevi of trunk: Secondary | ICD-10-CM | POA: Diagnosis not present

## 2018-06-06 DIAGNOSIS — Z13820 Encounter for screening for osteoporosis: Secondary | ICD-10-CM | POA: Diagnosis not present

## 2018-06-06 DIAGNOSIS — Z1231 Encounter for screening mammogram for malignant neoplasm of breast: Secondary | ICD-10-CM | POA: Diagnosis not present

## 2018-06-14 DIAGNOSIS — Z6829 Body mass index (BMI) 29.0-29.9, adult: Secondary | ICD-10-CM | POA: Diagnosis not present

## 2018-06-14 DIAGNOSIS — Z01419 Encounter for gynecological examination (general) (routine) without abnormal findings: Secondary | ICD-10-CM | POA: Diagnosis not present

## 2018-07-06 DIAGNOSIS — Z1321 Encounter for screening for nutritional disorder: Secondary | ICD-10-CM | POA: Diagnosis not present

## 2018-07-06 DIAGNOSIS — Z13228 Encounter for screening for other metabolic disorders: Secondary | ICD-10-CM | POA: Diagnosis not present

## 2018-07-06 DIAGNOSIS — Z1329 Encounter for screening for other suspected endocrine disorder: Secondary | ICD-10-CM | POA: Diagnosis not present

## 2018-07-06 DIAGNOSIS — Z13220 Encounter for screening for lipoid disorders: Secondary | ICD-10-CM | POA: Diagnosis not present

## 2018-07-19 DIAGNOSIS — H6983 Other specified disorders of Eustachian tube, bilateral: Secondary | ICD-10-CM | POA: Diagnosis not present

## 2018-07-19 DIAGNOSIS — B309 Viral conjunctivitis, unspecified: Secondary | ICD-10-CM | POA: Diagnosis not present

## 2018-07-19 DIAGNOSIS — J069 Acute upper respiratory infection, unspecified: Secondary | ICD-10-CM | POA: Diagnosis not present

## 2018-11-10 DIAGNOSIS — R509 Fever, unspecified: Secondary | ICD-10-CM | POA: Diagnosis not present

## 2019-02-13 DIAGNOSIS — D1801 Hemangioma of skin and subcutaneous tissue: Secondary | ICD-10-CM | POA: Diagnosis not present

## 2019-02-13 DIAGNOSIS — L638 Other alopecia areata: Secondary | ICD-10-CM | POA: Diagnosis not present

## 2019-02-13 DIAGNOSIS — L814 Other melanin hyperpigmentation: Secondary | ICD-10-CM | POA: Diagnosis not present

## 2019-02-13 DIAGNOSIS — D225 Melanocytic nevi of trunk: Secondary | ICD-10-CM | POA: Diagnosis not present

## 2019-02-23 DIAGNOSIS — K529 Noninfective gastroenteritis and colitis, unspecified: Secondary | ICD-10-CM | POA: Diagnosis not present

## 2019-02-23 DIAGNOSIS — R1032 Left lower quadrant pain: Secondary | ICD-10-CM | POA: Diagnosis not present

## 2019-02-24 ENCOUNTER — Other Ambulatory Visit: Payer: Self-pay | Admitting: Internal Medicine

## 2019-02-24 DIAGNOSIS — K5720 Diverticulitis of large intestine with perforation and abscess without bleeding: Secondary | ICD-10-CM | POA: Diagnosis not present

## 2019-02-24 DIAGNOSIS — R1032 Left lower quadrant pain: Secondary | ICD-10-CM

## 2019-02-24 DIAGNOSIS — K529 Noninfective gastroenteritis and colitis, unspecified: Secondary | ICD-10-CM | POA: Diagnosis not present

## 2019-03-03 ENCOUNTER — Other Ambulatory Visit: Payer: BLUE CROSS/BLUE SHIELD

## 2019-03-07 DIAGNOSIS — K5792 Diverticulitis of intestine, part unspecified, without perforation or abscess without bleeding: Secondary | ICD-10-CM | POA: Diagnosis not present

## 2019-03-15 ENCOUNTER — Other Ambulatory Visit: Payer: Self-pay | Admitting: Internal Medicine

## 2019-03-15 DIAGNOSIS — R1032 Left lower quadrant pain: Secondary | ICD-10-CM

## 2019-03-21 ENCOUNTER — Ambulatory Visit
Admission: RE | Admit: 2019-03-21 | Discharge: 2019-03-21 | Disposition: A | Discharge status: Home / Self Care | Payer: BC Managed Care – PPO | Source: Ambulatory Visit | Attending: Internal Medicine | Admitting: Internal Medicine

## 2019-03-21 DIAGNOSIS — R1032 Left lower quadrant pain: Secondary | ICD-10-CM

## 2019-03-21 DIAGNOSIS — K5730 Diverticulosis of large intestine without perforation or abscess without bleeding: Secondary | ICD-10-CM | POA: Diagnosis not present

## 2019-03-21 DIAGNOSIS — N281 Cyst of kidney, acquired: Secondary | ICD-10-CM | POA: Diagnosis not present

## 2019-03-21 DIAGNOSIS — K5720 Diverticulitis of large intestine with perforation and abscess without bleeding: Secondary | ICD-10-CM | POA: Diagnosis not present

## 2019-03-21 DIAGNOSIS — R197 Diarrhea, unspecified: Secondary | ICD-10-CM | POA: Diagnosis not present

## 2019-03-21 MED ORDER — IOPAMIDOL (ISOVUE-300) INJECTION 61%
100.0000 mL | Freq: Once | INTRAVENOUS | Status: AC | PRN
  Administered 2019-03-21: 100 mL via INTRAVENOUS

## 2019-03-23 DIAGNOSIS — R197 Diarrhea, unspecified: Secondary | ICD-10-CM | POA: Diagnosis not present

## 2019-03-23 DIAGNOSIS — I479 Paroxysmal tachycardia, unspecified: Secondary | ICD-10-CM | POA: Diagnosis not present

## 2019-03-23 DIAGNOSIS — E559 Vitamin D deficiency, unspecified: Secondary | ICD-10-CM | POA: Diagnosis not present

## 2019-03-23 DIAGNOSIS — G43909 Migraine, unspecified, not intractable, without status migrainosus: Secondary | ICD-10-CM | POA: Diagnosis not present

## 2019-03-23 DIAGNOSIS — Z0000 Encounter for general adult medical examination without abnormal findings: Secondary | ICD-10-CM | POA: Diagnosis not present

## 2019-03-23 DIAGNOSIS — Z13220 Encounter for screening for lipoid disorders: Secondary | ICD-10-CM | POA: Diagnosis not present

## 2019-04-04 DIAGNOSIS — Z20828 Contact with and (suspected) exposure to other viral communicable diseases: Secondary | ICD-10-CM | POA: Diagnosis not present

## 2019-04-27 DIAGNOSIS — Z1159 Encounter for screening for other viral diseases: Secondary | ICD-10-CM | POA: Diagnosis not present

## 2019-05-01 ENCOUNTER — Ambulatory Visit: Payer: Self-pay | Admitting: General Surgery

## 2019-05-01 DIAGNOSIS — K5792 Diverticulitis of intestine, part unspecified, without perforation or abscess without bleeding: Secondary | ICD-10-CM | POA: Diagnosis not present

## 2019-05-01 NOTE — H&P (Signed)
History of Present Illness [name] Ruff MD; [id number] 12:18 PM) The patient is a 63 year old female who presents with diverticulitis. 63 year old female who presents to the office for evaluation of recurrent diverticulitis. She states that in June, she developed left lower quadrant pain and fevers. She was treated as an outpatient for diverticulitis. She developed another episode in July which was treated again with antibiotics. She then developed severe abdominal pain in late September 2020 and underwent a CT scan. This showed diverticulitis. Once again, and she was started on Cipro and Flagyl. Her symptoms have resolved at this point. F/U CT scan is normal. She is here to discuss surgery. She will undergo colonoscopy tomorrow.   Problem List/Past Medical [name] Ruff, MD; [id number] 12:22 PM) DIVERTICULITIS [phone])  Past Surgical History [name] Ruff, MD; [id number] 12:22 PM) Oral Surgery Tonsillectomy  Diagnostic Studies History [name] Ruff, MD; [id number] 12:22 PM) Colonoscopy 1-5 years ago Mammogram within last year Pap Smear 1-5 years ago  Allergies (Tanisha A. Owens Shark, [place]; 05/01/2019 11:51 AM) Codeine/Codeine Derivatives Nausea. Ampicillin *PENICILLINS* Rash. Allergies Reconciled  Medication History [name] Ruff, MD; [id number] 12:22 PM) Ciprofloxacin HCl (500MG  Tablet, Oral) Active. Metoprolol Succinate ER (50MG  Tablet ER 24HR, Oral) Active. metroNIDAZOLE (500MG  Tablet, Oral) Active. Multi-Vitamin (Oral) Active. Probiotic (Oral) Active. Medications Reconciled Neomycin Sulfate (500MG  Tablet, 2 (two) Oral SEE NOTE, Taken starting 05/01/2019) Active. (TAKE TWO TABLETS AT 2 PM, 3 PM, AND 10 PM THE DAY PRIOR TO SURGERY) Flagyl (500MG  Tablet, 2 (two) Oral SEE NOTE, Taken starting 05/01/2019) Active. (Take at 2pm, 3pm, and 10pm the day prior to your colon operation)  Social History [name] Ruff, MD; [id number] 12:22 PM) Alcohol use Occasional  alcohol use. Caffeine use Coffee. No drug use Tobacco use Never smoker.  Family History [name] Ruff, MD; [id number] 12:22 PM) Heart Disease Father. Heart disease in female family member before age 74 Migraine Headache Father. Prostate Cancer Brother, Father.  Pregnancy / Birth History [name] Ruff, MD; [id number] 12:22 PM) Age at menarche 63 years. Age of menopause 36-50 Gravida 3 Length (months) of breastfeeding >34 Maternal age 82-30 Para 2  Other Problems [name] Ruff, MD; [id number] 12:22 PM) Diverticulosis Gastroesophageal Reflux Disease Migraine Headache     Review of Systems [name] Ruff MD; [id number] 12:22 PM) General Not Present- Appetite Loss, Chills, Fatigue, Fever, Night Sweats, Weight Gain and Weight Loss. Skin Not Present- Change in Wart/Mole, Dryness, Hives, Jaundice, New Lesions, Non-Healing Wounds, Rash and Ulcer. HEENT Present- Wears glasses/contact lenses. Not Present- Earache, Hearing Loss, Hoarseness, Nose Bleed, Oral Ulcers, Ringing in the Ears, Seasonal Allergies, Sinus Pain, Sore Throat, Visual Disturbances and Yellow Eyes. Respiratory Not Present- Bloody sputum, Chronic Cough, Difficulty Breathing, Snoring and Wheezing. Breast Not Present- Breast Mass, Breast Pain, Nipple Discharge and Skin Changes. Cardiovascular Not Present- Chest Pain, Difficulty Breathing Lying Down, Leg Cramps, Palpitations, Rapid Heart Rate, Shortness of Breath and Swelling of Extremities. Gastrointestinal Present- Abdominal Pain, Change in Bowel Habits and Chronic diarrhea. Not Present- Bloating, Bloody Stool, Constipation, Difficulty Swallowing, Excessive gas, Gets full quickly at meals, Hemorrhoids, Indigestion, Nausea, Rectal Pain and Vomiting. Female Genitourinary Present- Frequency and Pelvic Pain. Not Present- Nocturia, Painful Urination and Urgency. Musculoskeletal Not Present- Back Pain, Joint Pain, Joint Stiffness, Muscle Pain, Muscle Weakness and  Swelling of Extremities. Neurological Not Present- Decreased Memory, Fainting, Headaches, Numbness, Seizures, Tingling, Tremor, Trouble walking and Weakness. Psychiatric Present- Anxiety. Not Present- Bipolar, Change in Sleep Pattern, Depression, Fearful and Frequent crying. Endocrine Not Present- Cold Intolerance, Excessive Hunger, Hair  Changes, Heat Intolerance, Hot flashes and New Diabetes. Hematology Present- Persistent Infections. Not Present- Blood Thinners, Easy Bruising, Excessive bleeding, Gland problems and HIV.  Vitals (Tanisha A. Brown RMA; 05/01/2019 11:51 AM) 05/01/2019 11:51 AM Weight: 147 lb Height: 63.5in Body Surface Area: 1.71 m Body Mass Index: 25.63 kg/m  Temp.: 97.11F  Pulse: 87 (Regular)  BP: 122/74 (Sitting, Left Arm, Standard)        Physical Exam [name] Ruff MD; [id number] 12:22 PM)  General Mental Status-Alert. General Appearance-Cooperative, Not in acute distress. Build & Nutrition-Well nourished. Posture-Normal posture. Gait-Normal.  Head and Neck Head-normocephalic, atraumatic with no lesions or palpable masses. Trachea-midline.  Chest and Lung Exam Chest and lung exam reveals -on auscultation, normal breath sounds, no adventitious sounds and normal vocal resonance.  Cardiovascular Cardiovascular examination reveals -normal heart sounds, regular rate and rhythm with no murmurs and no digital clubbing, cyanosis, edema, increased warmth or tenderness.  Abdomen Inspection Inspection of the abdomen reveals - No Hernias. Palpation/Percussion Palpation and Percussion of the abdomen reveal - Soft, Non Tender(Mild tenderness to palpation suprapubic), No Rigidity (guarding), No hepatosplenomegaly and No Palpable abdominal masses.  Neurologic Neurologic evaluation reveals -alert and oriented x 3 with no impairment of recent or remote memory, normal attention span and ability to concentrate, normal sensation and normal  coordination.  Musculoskeletal Normal Exam - Bilateral-Upper Extremity Strength Normal and Lower Extremity Strength Normal.    Assessment & Plan [name] Ruff MD; [id number] 12:22 PM)  DIVERTICULITIS [id number]) Impression: 63 year old female with recurrent episodes of diverticulitis who presents to the office to discuss surgery. Her last episode was several months ago. She is scheduled to undergo a colonoscopy tomorrow. I have recommended a robotic-assisted partial colectomy. Given the location of the diverticulitis, I have recommended preoperative ureteral injections to identify the ureters during surgery. We have discussed that her diarrhea may not improve after surgery. The surgery and anatomy were described to the patient as well as the risks of surgery and the possible complications. These include: Bleeding, deep abdominal infections and possible wound complications such as hernia and infection, damage to adjacent structures, leak of surgical connections, which can lead to other surgeries and possibly an ostomy, possible need for other procedures, such as abscess drains in radiology, possible prolonged hospital stay, possible diarrhea from removal of part of the colon, possible constipation from narcotics, possible bowel, bladder or sexual dysfunction if having rectal surgery, prolonged fatigue/weakness or appetite loss, possible early recurrence of of disease, possible complications of their medical problems such as heart disease or arrhythmias or lung problems, death (less than 1%). I believe the patient understands and wishes to proceed with the surgery.

## 2019-05-02 DIAGNOSIS — K6289 Other specified diseases of anus and rectum: Secondary | ICD-10-CM | POA: Diagnosis not present

## 2019-05-02 DIAGNOSIS — K640 First degree hemorrhoids: Secondary | ICD-10-CM | POA: Diagnosis not present

## 2019-05-02 DIAGNOSIS — R197 Diarrhea, unspecified: Secondary | ICD-10-CM | POA: Diagnosis not present

## 2019-05-02 DIAGNOSIS — K5730 Diverticulosis of large intestine without perforation or abscess without bleeding: Secondary | ICD-10-CM | POA: Diagnosis not present

## 2019-05-02 DIAGNOSIS — K5732 Diverticulitis of large intestine without perforation or abscess without bleeding: Secondary | ICD-10-CM | POA: Diagnosis not present

## 2019-06-26 DIAGNOSIS — Z01419 Encounter for gynecological examination (general) (routine) without abnormal findings: Secondary | ICD-10-CM | POA: Diagnosis not present

## 2019-06-26 DIAGNOSIS — Z1231 Encounter for screening mammogram for malignant neoplasm of breast: Secondary | ICD-10-CM | POA: Diagnosis not present

## 2019-06-26 DIAGNOSIS — Z803 Family history of malignant neoplasm of breast: Secondary | ICD-10-CM | POA: Diagnosis not present

## 2019-06-26 DIAGNOSIS — Z8042 Family history of malignant neoplasm of prostate: Secondary | ICD-10-CM | POA: Diagnosis not present

## 2019-06-26 DIAGNOSIS — Z6826 Body mass index (BMI) 26.0-26.9, adult: Secondary | ICD-10-CM | POA: Diagnosis not present

## 2019-06-30 ENCOUNTER — Other Ambulatory Visit: Payer: Self-pay | Admitting: Urology

## 2019-07-01 ENCOUNTER — Other Ambulatory Visit (HOSPITAL_COMMUNITY): Payer: BC Managed Care – PPO

## 2019-07-05 ENCOUNTER — Inpatient Hospital Stay: Admit: 2019-07-05 | Payer: BC Managed Care – PPO | Admitting: General Surgery

## 2019-07-05 SURGERY — COLECTOMY, PARTIAL, ROBOT-ASSISTED, LAPAROSCOPIC
Anesthesia: General

## 2019-07-21 IMAGING — MG 2D DIGITAL DIAGNOSTIC UNILATERAL RIGHT MAMMOGRAM WITH CAD AND AD
6 series · 6 of 14 positions shown · non-contrast
Comparison: Previous exams including most recent bilateral
screening mammogram dated 04/27/2017.

CLINICAL DATA: Ordering physician describes a palpable lump within
the right breast at the 7 o'clock axis.

EXAM:
2D DIGITAL DIAGNOSTIC RIGHT MAMMOGRAM WITH CAD AND ADJUNCT TOMO
ULTRASOUND RIGHT BREAST

[R XCCL synth-2D]
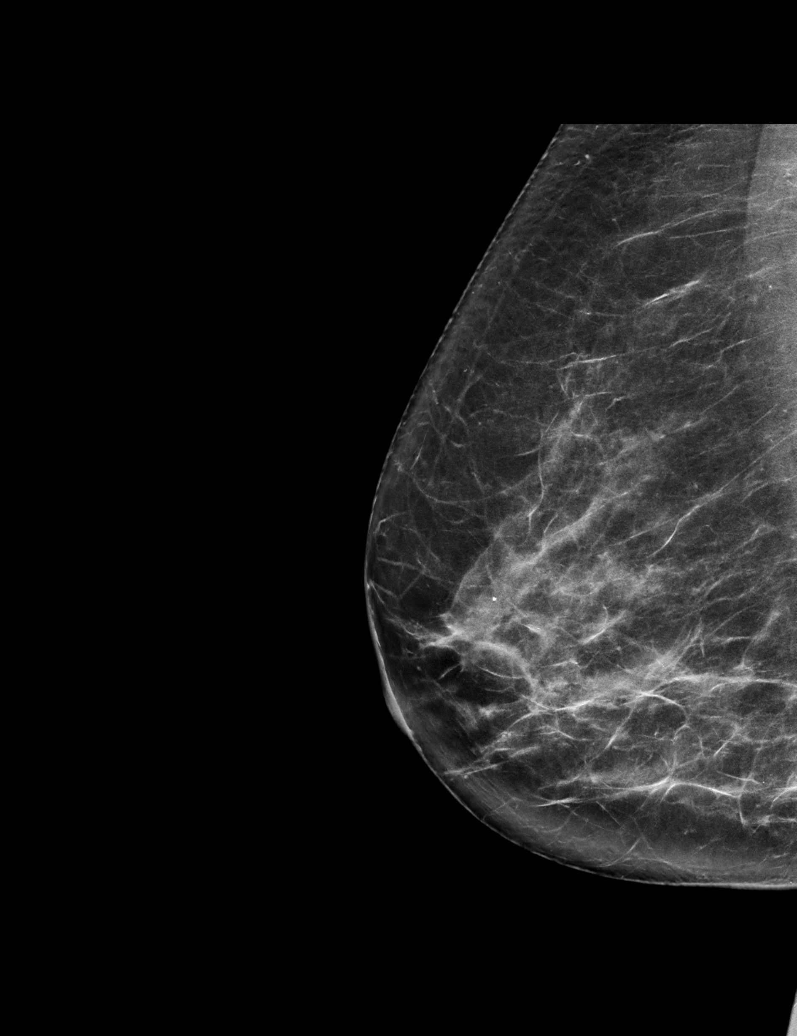

[R ML]
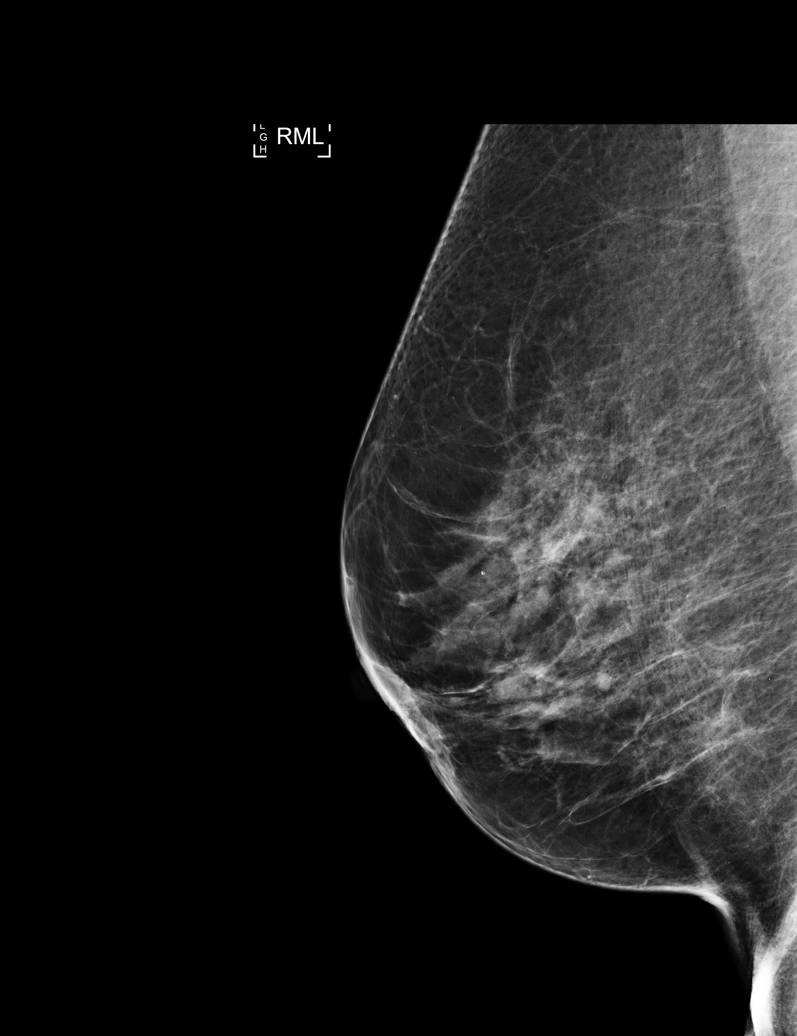

[R ML synth-2D]
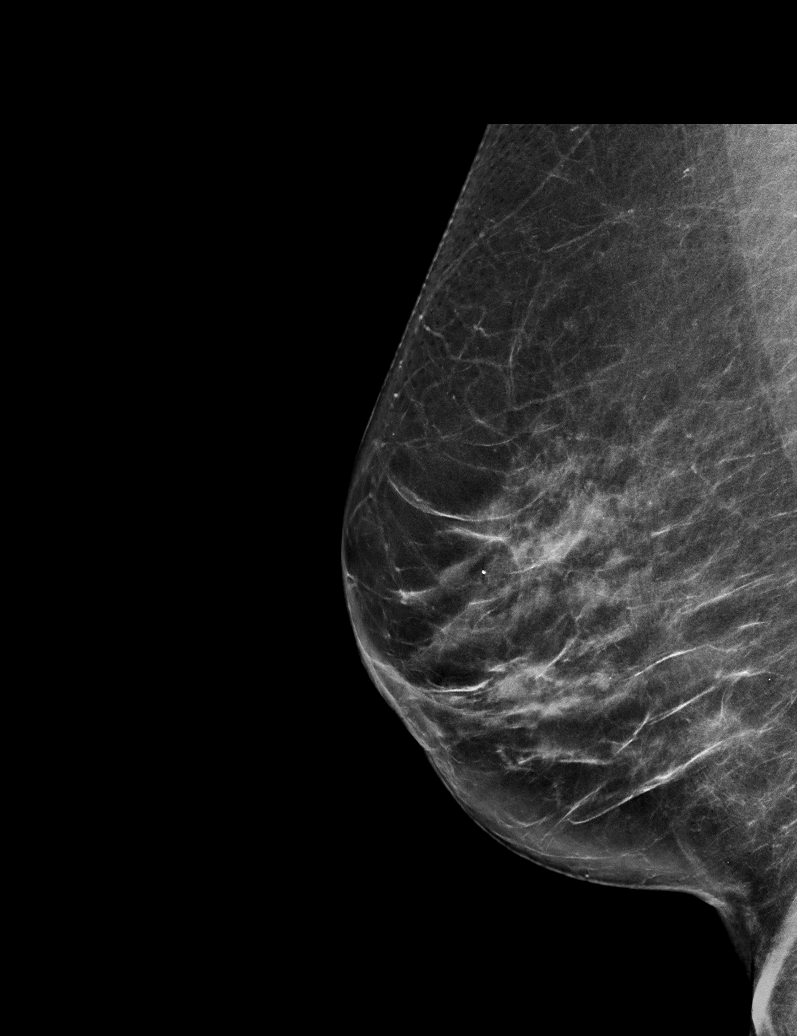

[R XCCL]
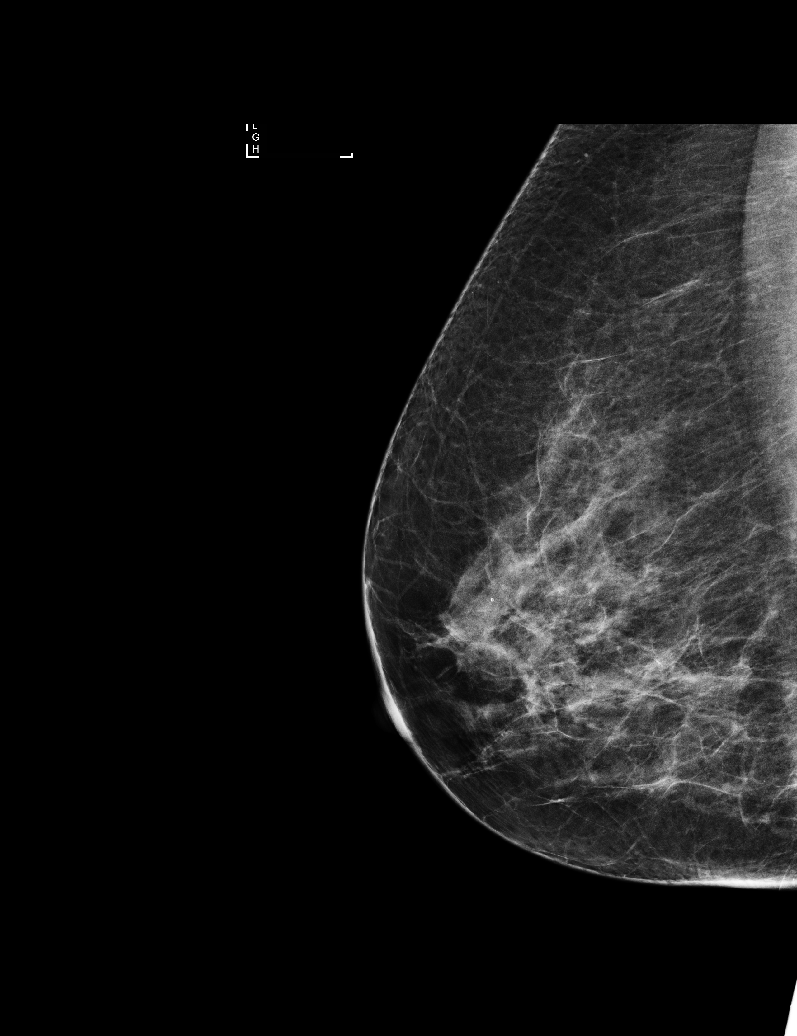

[R XCCL tomo · tomo slice 35/69.0]
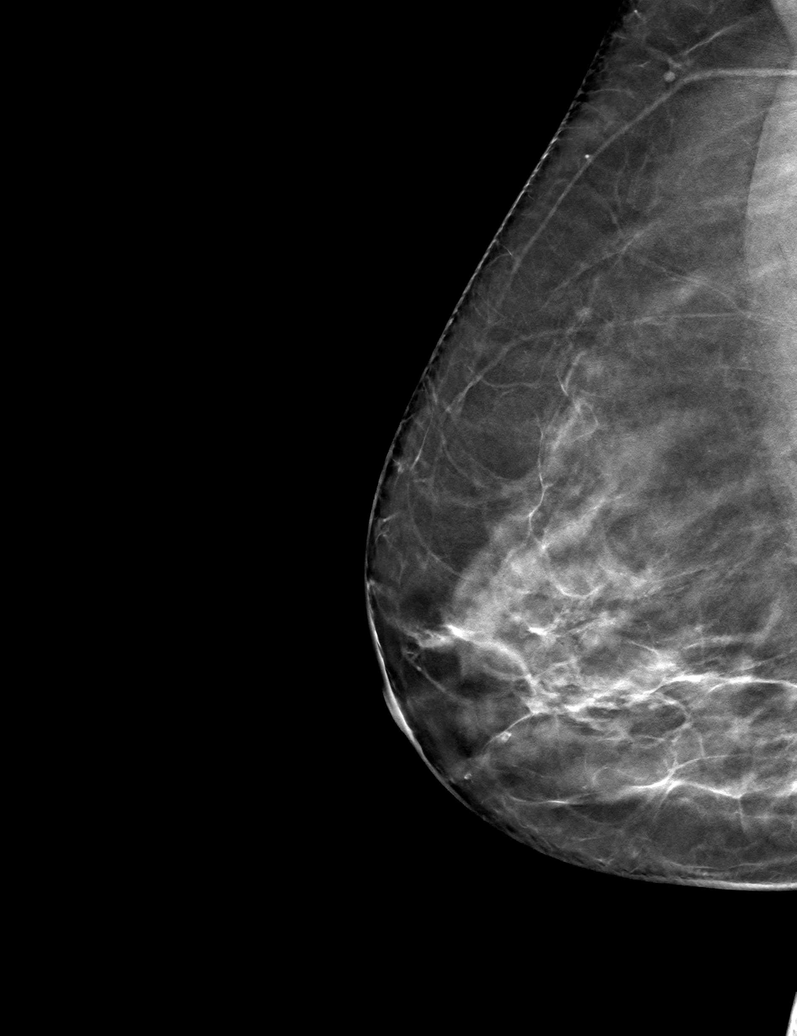

[R ML tomo · tomo slice 35/70.0]
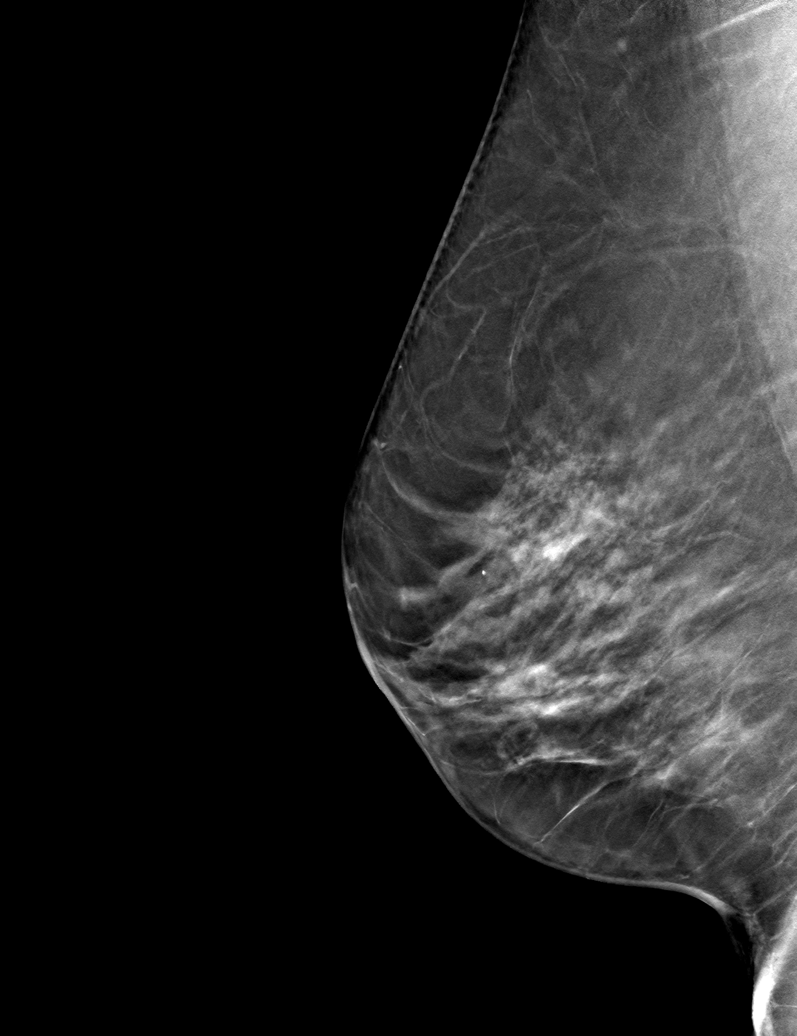

[6 of 14 positions shown; findings below may reference images not displayed]

ACR Breast Density Category c: The breast tissue is heterogeneously
dense, which may obscure small masses.
FINDINGS: Diagnostic views of the right breast, exaggerated CC and true
lateral views, were obtained today. On the true lateral view, an
oval circumscribed mass is identified within the slightly lower
right breast, at the skin surface, measuring 4 mm. No additional
masses, suspicious calcifications or secondary signs of malignancy
are identified. Specifically, there is no evidence of malignancy
within the outer right breast corresponding to the area of clinical
concern.

Mammographic images were processed with CAD.

Targeted ultrasound is performed, evaluating the entire lower outer
quadrant of the right breast with particular attention to the 7
o'clock axis as directed by the ordering physician's request,
showing only normal fibroglandular tissues and fat lobules
throughout. No solid or cystic mass. No sebaceous cyst or skin
thickening.

Of note, the 4 mm circumscribed mass seen at the lateral skin
surface on earlier mammogram corresponds to a skin mole.
IMPRESSION: No evidence of malignancy within the right breast.

RECOMMENDATION:
Screening mammogram in one year.(Code:[id number]-[id number])

I have discussed the findings and recommendations with the patient.
Results were also provided in writing at the conclusion of the
visit. If applicable, a reminder letter will be sent to the patient
regarding the next appointment.

BI-RADS CATEGORY  1: Negative.

## 2019-07-21 NOTE — Progress Notes (Signed)
PCP - Josetta Huddle Cardiologist -   Chest x-ray -  EKG -  Stress Test -  ECHO -  Cardiac Cath -   Sleep Study -  CPAP -   Fasting Blood Sugar -  Checks Blood Sugar _____ times a day  Blood Thinner Instructions: Aspirin Instructions: Last Dose:  Anesthesia review:   Patient denies shortness of breath, fever, cough and chest pain at PAT appointment  NONE   Patient verbalized understanding of instructions that were given to them at the PAT appointment. Patient was also instructed that they will need to review over the PAT instructions again at home before surgery.

## 2019-07-26 ENCOUNTER — Encounter (HOSPITAL_COMMUNITY)
Admission: RE | Admit: 2019-07-26 | Discharge: 2019-07-26 | Disposition: A | Discharge status: Home / Self Care | Payer: PPO | Source: Ambulatory Visit | Attending: General Surgery | Admitting: General Surgery

## 2019-07-26 ENCOUNTER — Other Ambulatory Visit: Payer: Self-pay

## 2019-07-26 ENCOUNTER — Encounter (HOSPITAL_COMMUNITY): Payer: Self-pay

## 2019-07-26 DIAGNOSIS — K5720 Diverticulitis of large intestine with perforation and abscess without bleeding: Secondary | ICD-10-CM | POA: Diagnosis not present

## 2019-07-26 DIAGNOSIS — Z79899 Other long term (current) drug therapy: Secondary | ICD-10-CM | POA: Insufficient documentation

## 2019-07-26 DIAGNOSIS — Z01812 Encounter for preprocedural laboratory examination: Secondary | ICD-10-CM | POA: Insufficient documentation

## 2019-07-26 HISTORY — DX: Pneumonia, unspecified organism: J18.9

## 2019-07-26 HISTORY — DX: Malignant (primary) neoplasm, unspecified: C80.1

## 2019-07-26 HISTORY — DX: Nausea with vomiting, unspecified: R11.2

## 2019-07-26 HISTORY — DX: Other specified postprocedural states: Z98.890

## 2019-07-26 HISTORY — DX: Cardiac arrhythmia, unspecified: I49.9

## 2019-07-26 LAB — CBC
HCT: 41 % (ref 36.0–46.0)
Hemoglobin: 13.5 g/dL (ref 12.0–15.0)
MCH: 31.3 pg (ref 26.0–34.0)
MCHC: 32.9 g/dL (ref 30.0–36.0)
MCV: 94.9 fL (ref 80.0–100.0)
Platelets: 186 10*3/uL (ref 150–400)
RBC: 4.32 MIL/uL (ref 3.87–5.11)
RDW: 11.5 % (ref 11.5–15.5)
WBC: 4.2 10*3/uL (ref 4.0–10.5)
nRBC: 0 % (ref 0.0–0.2)

## 2019-07-26 LAB — BASIC METABOLIC PANEL
Anion gap: 7 (ref 5–15)
BUN: 12 mg/dL (ref 8–23)
CO2: 28 mmol/L (ref 22–32)
Calcium: 9.4 mg/dL (ref 8.9–10.3)
Chloride: 106 mmol/L (ref 98–111)
Creatinine, Ser: 0.8 mg/dL (ref 0.44–1.00)
GFR calc Af Amer: >60 mL/min (ref >60)
GFR calc non Af Amer: >60 mL/min (ref >60)
Glucose, Bld: 91 mg/dL (ref 70–99)
Potassium: 4.7 mmol/L (ref 3.5–5.1)
Sodium: 141 mmol/L (ref 135–145)

## 2019-07-26 LAB — ABO/RH: ABO/RH(D): B POS

## 2019-07-26 NOTE — Patient Instructions (Addendum)
DUE TO COVID-19 ONLY ONE VISITOR IS ALLOWED TO COME WITH YOU AND STAY IN THE WAITING ROOM ONLY DURING PRE OP AND PROCEDURE DAY OF SURGERY. THE 1 VISITOR MAY VISIT WITH YOU AFTER SURGERY IN YOUR PRIVATE ROOM DURING VISITING HOURS ONLY!    10am- 8pm  YOU NEED TO HAVE A COVID 19 TEST ON_3-6-21______ @_12 :30______, THIS TEST MUST BE DONE BEFORE SURGERY, COME  [address], [place] , [address].  ([place]) ONCE YOUR COVID TEST IS COMPLETED, PLEASE BEGIN THE QUARANTINE INSTRUCTIONS AS OUTLINED IN YOUR HANDOUT.                [name] [name]  07/26/2019   Your procedure is scheduled on: 08-02-19   Report to [facility] Main  Entrance   Report to admitting at      1100  AM     Call this number if you have problems the morning of surgery [phone]                Follow bowel prep per MD order   Remember: DRINK 2 PRESURGERY ENSURE [place] AT  1000 PM AND 1 PRESURGERY DRINK THE DAY OF THE PROCEDURE 3 HOURS PRIOR TO SCHEDULED SURGERY. NO SOLIDS AFTER MIDNIGHT THE DAY PRIOR TO THE SURGERY. NOTHING BY MOUTH EXCEPT CLEAR LIQUIDS UNTIL THREE HOURS PRIOR TO SCHEDULED SURGERY. PLEASE FINISH PRESURGERY ENSURE DRINK PER SURGEON ORDER 3 HOURS PRIOR TO SCHEDULED SURGERY TIME WHICH NEEDS TO BE COMPLETED AT _1000 am ________.    CLEAR LIQUID DIET   Foods Allowed                                                                                           Foods Excluded  Coffee and tea, regular and decaf  No creamer                                              liquids that you cannot  Plain Jell-O any favor except red or purple                                           see through such as: Fruit ices (not with fruit pulp)                                                                       milk, soups, orange juice  Iced Popsicles  All solid food Carbonated beverages, regular and diet                                     Cranberry, grape and apple juices Sports drinks like Gatorade Lightly seasoned clear broth or consume(fat free) Sugar, honey syrup  Sample Menu Breakfast                                Lunch                                     Supper Cranberry juice                    Beef broth                            Chicken broth Jell-O                                     Grape juice                           Apple juice Coffee or tea                        Jell-O                                      Popsicle                                                Coffee or tea                        Coffee or tea  _____________________________________________________________________      BRUSH YOUR TEETH MORNING OF SURGERY AND RINSE YOUR MOUTH OUT, NO CHEWING GUM CANDY OR MINTS.     Take these medicines the morning of surgery with A SIP OF WATER: NONE                                 You may not have any metal on your body including hair pins and              piercings  Do not wear jewelry, make-up, lotions, powders or perfumes, deodorant             Do not wear nail polish on your fingernails.  Do not shave  48 hours prior to surgery.     Do not bring valuables to the hospital. [place].  Contacts, dentures or bridgework may not be worn into surgery.                 Please read over the  following fact sheets you were given: _____________________________________________________________________           [facility] - Preparing for Surgery Before surgery, you can play an important role.  Because skin is not sterile, your skin needs to be as free of germs as possible.  You can reduce the number of germs on your skin by washing with CHG (chlorahexidine gluconate) soap before surgery.  CHG is an antiseptic cleaner which kills germs and bonds with the skin to continue killing germs even after washing. Please DO NOT  use if you have an allergy to CHG or antibacterial soaps.  If your skin becomes reddened/irritated stop using the CHG and inform your nurse when you arrive at Short Stay. Do not shave (including legs and underarms) for at least 48 hours prior to the first CHG shower.  You may shave your face/neck. Please follow these instructions carefully:  1.  Shower with CHG Soap the night before surgery and the  morning of Surgery.  2.  If you choose to wash your hair, wash your hair first as usual with your  normal  shampoo.  3.  After you shampoo, rinse your hair and body thoroughly to remove the  shampoo.                           4.  Use CHG as you would any other liquid soap.  You can apply chg directly  to the skin and wash                       Gently with a scrungie or clean washcloth.  5.  Apply the CHG Soap to your body ONLY FROM THE NECK DOWN.   Do not use on face/ open                           Wound or open sores. Avoid contact with eyes, ears mouth and genitals (private parts).                       Wash face,  Genitals (private parts) with your normal soap.             6.  Wash thoroughly, paying special attention to the area where your surgery  will be performed.  7.  Thoroughly rinse your body with warm water from the neck down.  8.  DO NOT shower/wash with your normal soap after using and rinsing off  the CHG Soap.                9.  Pat yourself dry with a clean towel.            10.  Wear clean pajamas.            11.  Place clean sheets on your bed the night of your first shower and do not  sleep with pets. Day of Surgery : Do not apply any lotions/deodorants the morning of surgery.  Please wear clean clothes to the hospital/surgery center.  FAILURE TO FOLLOW THESE INSTRUCTIONS MAY RESULT IN THE CANCELLATION OF YOUR SURGERY PATIENT SIGNATURE_________________________________  NURSE  SIGNATURE__________________________________  ________________________________________________________________________   [name] [name]  An incentive spirometer is a tool that can help keep your lungs clear and active. This tool measures how well you are filling your lungs with each breath. Taking long deep breaths may help reverse  or decrease the chance of developing breathing (pulmonary) problems (especially infection) following:  A long period of time when you are unable to move or be active. BEFORE THE PROCEDURE   If the spirometer includes an indicator to show your best effort, your nurse or respiratory therapist will set it to a desired goal.  If possible, sit up straight or lean slightly forward. Try not to slouch.  Hold the incentive spirometer in an upright position. INSTRUCTIONS FOR USE  1. Sit on the edge of your bed if possible, or sit up as far as you can in bed or on a chair. 2. Hold the incentive spirometer in an upright position. 3. Breathe out normally. 4. Place the mouthpiece in your mouth and seal your lips tightly around it. 5. Breathe in slowly and as deeply as possible, raising the piston or the ball toward the top of the column. 6. Hold your breath for 3-5 seconds or for as long as possible. Allow the piston or ball to fall to the bottom of the column. 7. Remove the mouthpiece from your mouth and breathe out normally. 8. Rest for a few seconds and repeat Steps 1 through 7 at least 10 times every 1-2 hours when you are awake. Take your time and take a few normal breaths between deep breaths. 9. The spirometer may include an indicator to show your best effort. Use the indicator as a goal to work toward during each repetition. 10. After each set of 10 deep breaths, practice coughing to be sure your lungs are clear. If you have an incision (the cut made at the time of surgery), support your incision when coughing by placing a pillow or rolled up towels firmly  against it. Once you are able to get out of bed, walk around indoors and cough well. You may stop using the incentive spirometer when instructed by your caregiver.  RISKS AND COMPLICATIONS  Take your time so you do not get dizzy or light-headed.  If you are in pain, you may need to take or ask for pain medication before doing incentive spirometry. It is harder to take a deep breath if you are having pain. AFTER USE  Rest and breathe slowly and easily.  It can be helpful to keep track of a log of your progress. Your caregiver can provide you with a simple table to help with this. If you are using the spirometer at home, follow these instructions: [place] IF:   You are having difficultly using the spirometer.  You have trouble using the spirometer as often as instructed.  Your pain medication is not giving enough relief while using the spirometer.  You develop fever of 100.5 F (38.1 C) or higher. SEEK IMMEDIATE MEDICAL CARE IF:   You cough up bloody sputum that had not been present before.  You develop fever of 102 F (38.9 C) or greater.  You develop worsening pain at or near the incision site. MAKE SURE YOU:   Understand these instructions.  Will watch your condition.  Will get help right away if you are not doing well or get worse. Document Released: 09/21/2006 Document Revised: 08/03/2011 Document Reviewed: 11/22/2006 ExitCare Patient Information 2014 ExitCare, [place].   ________________________________________________________________________  WHAT IS A BLOOD TRANSFUSION? Blood Transfusion Information  A transfusion is the replacement of blood or some of its parts. Blood is made up of multiple cells which provide different functions.  Red blood cells carry oxygen and are used for blood loss replacement.  White blood cells fight against infection.  Platelets control bleeding.  Plasma helps clot blood.  Other blood products are available for  specialized needs, such as hemophilia or other clotting disorders. BEFORE THE TRANSFUSION  Who gives blood for transfusions?   Healthy volunteers who are fully evaluated to make sure their blood is safe. This is blood bank blood. Transfusion therapy is the safest it has ever been in the practice of medicine. Before blood is taken from a donor, a complete history is taken to make sure that person has no history of diseases nor engages in risky social behavior (examples are intravenous drug use or sexual activity with multiple partners). The donor's travel history is screened to minimize risk of transmitting infections, such as malaria. The donated blood is tested for signs of infectious diseases, such as HIV and hepatitis. The blood is then tested to be sure it is compatible with you in order to minimize the chance of a transfusion reaction. If you or a relative donates blood, this is often done in anticipation of surgery and is not appropriate for emergency situations. It takes many days to process the donated blood. RISKS AND COMPLICATIONS Although transfusion therapy is very safe and saves many lives, the main dangers of transfusion include:   Getting an infectious disease.  Developing a transfusion reaction. This is an allergic reaction to something in the blood you were given. Every precaution is taken to prevent this. The decision to have a blood transfusion has been considered carefully by your caregiver before blood is given. Blood is not given unless the benefits outweigh the risks. AFTER THE TRANSFUSION  Right after receiving a blood transfusion, you will usually feel much better and more energetic. This is especially true if your red blood cells have gotten low (anemic). The transfusion raises the level of the red blood cells which carry oxygen, and this usually causes an energy increase.  The nurse administering the transfusion will monitor you carefully for complications. HOME CARE  INSTRUCTIONS  No special instructions are needed after a transfusion. You may find your energy is better. Speak with your caregiver about any limitations on activity for underlying diseases you may have. SEEK MEDICAL CARE IF:   Your condition is not improving after your transfusion.  You develop redness or irritation at the intravenous (IV) site. SEEK IMMEDIATE MEDICAL CARE IF:  Any of the following symptoms occur over the next 12 hours:  Shaking chills.  You have a temperature by mouth above 102 F (38.9 C), not controlled by medicine.  Chest, back, or muscle pain.  People around you feel you are not acting correctly or are confused.  Shortness of breath or difficulty breathing.  Dizziness and fainting.  You get a rash or develop hives.  You have a decrease in urine output.  Your urine turns a dark color or changes to pink, red, or brown. Any of the following symptoms occur over the next 10 days:  You have a temperature by mouth above 102 F (38.9 C), not controlled by medicine.  Shortness of breath.  Weakness after normal activity.  The white part of the eye turns yellow (jaundice).  You have a decrease in the amount of urine or are urinating less often.  Your urine turns a dark color or changes to pink, red, or brown. Document Released: 05/08/2000 Document Revised: 08/03/2011 Document Reviewed: 12/26/2007 [facility] Patient Information 2014 [place], [place].  _______________________________________________________________________

## 2019-07-29 ENCOUNTER — Other Ambulatory Visit (HOSPITAL_COMMUNITY)
Admission: RE | Admit: 2019-07-29 | Discharge: 2019-07-29 | Disposition: A | Discharge status: Home / Self Care | Payer: PPO | Source: Ambulatory Visit | Attending: General Surgery | Admitting: General Surgery

## 2019-07-29 DIAGNOSIS — Z01812 Encounter for preprocedural laboratory examination: Secondary | ICD-10-CM | POA: Insufficient documentation

## 2019-07-29 DIAGNOSIS — Z20822 Contact with and (suspected) exposure to covid-19: Secondary | ICD-10-CM | POA: Diagnosis not present

## 2019-07-29 LAB — SARS CORONAVIRUS 2 (TAT 6-24 HRS): SARS Coronavirus 2: NEGATIVE

## 2019-07-31 ENCOUNTER — Ambulatory Visit: Payer: Self-pay | Admitting: General Surgery

## 2019-08-01 MED ORDER — BUPIVACAINE LIPOSOME 1.3 % IJ SUSP
20.0000 mL | Freq: Once | INTRAMUSCULAR | Status: DC
  Filled 2019-08-01: qty 20

## 2019-08-02 ENCOUNTER — Inpatient Hospital Stay (HOSPITAL_COMMUNITY)
Admission: RE | Admit: 2019-08-02 | Discharge: 2019-08-05 | DRG: 331 | Disposition: A | Discharge status: Home / Self Care | Payer: PPO | Attending: General Surgery | Admitting: General Surgery

## 2019-08-02 ENCOUNTER — Inpatient Hospital Stay (HOSPITAL_COMMUNITY): Payer: PPO | Admitting: Certified Registered Nurse Anesthetist

## 2019-08-02 ENCOUNTER — Other Ambulatory Visit: Payer: Self-pay

## 2019-08-02 ENCOUNTER — Inpatient Hospital Stay (HOSPITAL_COMMUNITY): Payer: PPO | Admitting: Physician Assistant

## 2019-08-02 ENCOUNTER — Encounter (HOSPITAL_COMMUNITY): Payer: Self-pay | Admitting: General Surgery

## 2019-08-02 ENCOUNTER — Encounter (HOSPITAL_COMMUNITY)
Admission: RE | Disposition: A | Discharge status: Home / Self Care | Payer: Self-pay | Source: Home / Self Care | Attending: General Surgery

## 2019-08-02 DIAGNOSIS — Z20822 Contact with and (suspected) exposure to covid-19: Secondary | ICD-10-CM | POA: Diagnosis not present

## 2019-08-02 DIAGNOSIS — Z408 Encounter for other prophylactic surgery: Secondary | ICD-10-CM | POA: Diagnosis not present

## 2019-08-02 DIAGNOSIS — K5732 Diverticulitis of large intestine without perforation or abscess without bleeding: Principal | ICD-10-CM | POA: Diagnosis present

## 2019-08-02 DIAGNOSIS — Z880 Allergy status to penicillin: Secondary | ICD-10-CM | POA: Diagnosis not present

## 2019-08-02 DIAGNOSIS — Z8701 Personal history of pneumonia (recurrent): Secondary | ICD-10-CM

## 2019-08-02 DIAGNOSIS — G43009 Migraine without aura, not intractable, without status migrainosus: Secondary | ICD-10-CM | POA: Diagnosis not present

## 2019-08-02 DIAGNOSIS — Z885 Allergy status to narcotic agent status: Secondary | ICD-10-CM

## 2019-08-02 DIAGNOSIS — K219 Gastro-esophageal reflux disease without esophagitis: Secondary | ICD-10-CM | POA: Diagnosis present

## 2019-08-02 DIAGNOSIS — Z79899 Other long term (current) drug therapy: Secondary | ICD-10-CM

## 2019-08-02 DIAGNOSIS — K5790 Diverticulosis of intestine, part unspecified, without perforation or abscess without bleeding: Secondary | ICD-10-CM | POA: Diagnosis not present

## 2019-08-02 DIAGNOSIS — Z85828 Personal history of other malignant neoplasm of skin: Secondary | ICD-10-CM | POA: Diagnosis not present

## 2019-08-02 HISTORY — PX: CYSTOSCOPY WITH STENT PLACEMENT: SHX5790

## 2019-08-02 LAB — TYPE AND SCREEN
ABO/RH(D): B POS
Antibody Screen: NEGATIVE

## 2019-08-02 SURGERY — COLECTOMY, PARTIAL, ROBOT-ASSISTED, LAPAROSCOPIC
Anesthesia: General | Site: Ureter

## 2019-08-02 MED ORDER — LIDOCAINE 2% (20 MG/ML) 5 ML SYRINGE
INTRAMUSCULAR | Status: AC
  Filled 2019-08-02: qty 5

## 2019-08-02 MED ORDER — SUGAMMADEX SODIUM 200 MG/2ML IV SOLN
INTRAVENOUS | Status: DC | PRN
  Administered 2019-08-02: 130 mg via INTRAVENOUS

## 2019-08-02 MED ORDER — ALVIMOPAN 12 MG PO CAPS
12.0000 mg | ORAL_CAPSULE | Freq: Two times a day (BID) | ORAL | Status: DC
  Administered 2019-08-03: 12 mg via ORAL
  Filled 2019-08-02 (×2): qty 1

## 2019-08-02 MED ORDER — ENOXAPARIN SODIUM 40 MG/0.4ML ~~LOC~~ SOLN
40.0000 mg | SUBCUTANEOUS | Status: DC
  Administered 2019-08-03 – 2019-08-05 (×3): 40 mg via SUBCUTANEOUS
  Filled 2019-08-02 (×3): qty 0.4

## 2019-08-02 MED ORDER — ONDANSETRON HCL 4 MG/2ML IJ SOLN
INTRAMUSCULAR | Status: DC | PRN
  Administered 2019-08-02: 4 mg via INTRAVENOUS

## 2019-08-02 MED ORDER — TRAMADOL HCL 50 MG PO TABS
50.0000 mg | ORAL_TABLET | Freq: Four times a day (QID) | ORAL | Status: DC | PRN
  Administered 2019-08-02 – 2019-08-04 (×5): 50 mg via ORAL
  Filled 2019-08-02 (×5): qty 1

## 2019-08-02 MED ORDER — MEPERIDINE HCL 50 MG/ML IJ SOLN: 6.2500 mg | INTRAMUSCULAR | Status: DC | PRN

## 2019-08-02 MED ORDER — PHENYLEPHRINE HCL-NACL 10-0.9 MG/250ML-% IV SOLN
INTRAVENOUS | Status: DC | PRN
  Administered 2019-08-02: 40 ug/min via INTRAVENOUS

## 2019-08-02 MED ORDER — DIPHENHYDRAMINE HCL 50 MG/ML IJ SOLN: 12.5000 mg | Freq: Four times a day (QID) | INTRAMUSCULAR | Status: DC | PRN

## 2019-08-02 MED ORDER — KETAMINE HCL 10 MG/ML IJ SOLN
INTRAMUSCULAR | Status: DC | PRN
  Administered 2019-08-02: 30 mg via INTRAVENOUS

## 2019-08-02 MED ORDER — SACCHAROMYCES BOULARDII 250 MG PO CAPS
250.0000 mg | ORAL_CAPSULE | Freq: Two times a day (BID) | ORAL | Status: DC
  Administered 2019-08-02 – 2019-08-05 (×6): 250 mg via ORAL
  Filled 2019-08-02 (×6): qty 1

## 2019-08-02 MED ORDER — HYDROMORPHONE HCL 1 MG/ML IJ SOLN
0.5000 mg | INTRAMUSCULAR | Status: DC | PRN
  Administered 2019-08-02: 0.5 mg via INTRAVENOUS
  Filled 2019-08-02: qty 0.5

## 2019-08-02 MED ORDER — STERILE WATER FOR INJECTION IJ SOLN
INTRAMUSCULAR | Status: AC
  Filled 2019-08-02: qty 10

## 2019-08-02 MED ORDER — DEXAMETHASONE SODIUM PHOSPHATE 10 MG/ML IJ SOLN
INTRAMUSCULAR | Status: DC | PRN
  Administered 2019-08-02: 5 mg via INTRAVENOUS

## 2019-08-02 MED ORDER — BUPIVACAINE-EPINEPHRINE 0.25% -1:200000 IJ SOLN
INTRAMUSCULAR | Status: DC | PRN
  Administered 2019-08-02: 30 mL

## 2019-08-02 MED ORDER — LACTATED RINGERS IR SOLN
Status: DC | PRN
  Administered 2019-08-02: 1000 mL

## 2019-08-02 MED ORDER — ACETAMINOPHEN 500 MG PO TABS
1000.0000 mg | ORAL_TABLET | Freq: Four times a day (QID) | ORAL | Status: DC
  Administered 2019-08-02 – 2019-08-05 (×10): 1000 mg via ORAL
  Filled 2019-08-02 (×10): qty 2

## 2019-08-02 MED ORDER — ONDANSETRON HCL 4 MG PO TABS: 4.0000 mg | ORAL_TABLET | Freq: Four times a day (QID) | ORAL | Status: DC | PRN

## 2019-08-02 MED ORDER — SODIUM CHLORIDE 0.9 % IV SOLN
2.0000 g | INTRAVENOUS | Status: AC
  Administered 2019-08-02: 2 g via INTRAVENOUS
  Filled 2019-08-02: qty 2

## 2019-08-02 MED ORDER — EPHEDRINE SULFATE-NACL 50-0.9 MG/10ML-% IV SOSY
PREFILLED_SYRINGE | INTRAVENOUS | Status: DC | PRN
  Administered 2019-08-02: 5 mg via INTRAVENOUS

## 2019-08-02 MED ORDER — ACETAMINOPHEN 325 MG PO TABS: 325.0000 mg | ORAL_TABLET | ORAL | Status: DC | PRN

## 2019-08-02 MED ORDER — GABAPENTIN 300 MG PO CAPS
300.0000 mg | ORAL_CAPSULE | ORAL | Status: AC
  Administered 2019-08-02: 300 mg via ORAL
  Filled 2019-08-02: qty 1

## 2019-08-02 MED ORDER — 0.9 % SODIUM CHLORIDE (POUR BTL) OPTIME
TOPICAL | Status: DC | PRN
  Administered 2019-08-02: 2000 mL

## 2019-08-02 MED ORDER — LIDOCAINE 2% (20 MG/ML) 5 ML SYRINGE
INTRAMUSCULAR | Status: DC | PRN
  Administered 2019-08-02: 70 mg via INTRAVENOUS

## 2019-08-02 MED ORDER — KCL IN DEXTROSE-NACL 20-5-0.45 MEQ/L-%-% IV SOLN
INTRAVENOUS | Status: DC
  Filled 2019-08-02 (×2): qty 1000

## 2019-08-02 MED ORDER — BUPIVACAINE-EPINEPHRINE (PF) 0.25% -1:200000 IJ SOLN
INTRAMUSCULAR | Status: AC
  Filled 2019-08-02: qty 30

## 2019-08-02 MED ORDER — EPHEDRINE 5 MG/ML INJ
INTRAVENOUS | Status: AC
  Filled 2019-08-02: qty 10

## 2019-08-02 MED ORDER — OXYCODONE HCL 5 MG/5ML PO SOLN: 5.0000 mg | Freq: Once | ORAL | Status: DC | PRN

## 2019-08-02 MED ORDER — LIDOCAINE HCL 2 % IJ SOLN
INTRAMUSCULAR | Status: AC
  Filled 2019-08-02: qty 20

## 2019-08-02 MED ORDER — ALVIMOPAN 12 MG PO CAPS
12.0000 mg | ORAL_CAPSULE | ORAL | Status: AC
  Administered 2019-08-02: 12 mg via ORAL
  Filled 2019-08-02: qty 1

## 2019-08-02 MED ORDER — ONDANSETRON HCL 4 MG/2ML IJ SOLN
INTRAMUSCULAR | Status: AC
  Filled 2019-08-02: qty 2

## 2019-08-02 MED ORDER — ROCURONIUM BROMIDE 50 MG/5ML IV SOSY
PREFILLED_SYRINGE | INTRAVENOUS | Status: DC | PRN
  Administered 2019-08-02: 20 mg via INTRAVENOUS
  Administered 2019-08-02: 60 mg via INTRAVENOUS
  Administered 2019-08-02: 10 mg via INTRAVENOUS

## 2019-08-02 MED ORDER — ONDANSETRON HCL 4 MG/2ML IJ SOLN: 4.0000 mg | Freq: Once | INTRAMUSCULAR | Status: DC | PRN

## 2019-08-02 MED ORDER — MIDAZOLAM HCL 5 MG/5ML IJ SOLN
INTRAMUSCULAR | Status: DC | PRN
  Administered 2019-08-02: 2 mg via INTRAVENOUS

## 2019-08-02 MED ORDER — ONDANSETRON HCL 4 MG/2ML IJ SOLN: 4.0000 mg | Freq: Four times a day (QID) | INTRAMUSCULAR | Status: DC | PRN

## 2019-08-02 MED ORDER — PROPOFOL 10 MG/ML IV BOLUS
INTRAVENOUS | Status: AC
  Filled 2019-08-02: qty 20

## 2019-08-02 MED ORDER — DIPHENHYDRAMINE HCL 12.5 MG/5ML PO ELIX: 12.5000 mg | ORAL_SOLUTION | Freq: Four times a day (QID) | ORAL | Status: DC | PRN

## 2019-08-02 MED ORDER — PROPOFOL 10 MG/ML IV BOLUS
INTRAVENOUS | Status: DC | PRN
  Administered 2019-08-02: 140 mg via INTRAVENOUS

## 2019-08-02 MED ORDER — MIDAZOLAM HCL 2 MG/2ML IJ SOLN
INTRAMUSCULAR | Status: AC
  Filled 2019-08-02: qty 2

## 2019-08-02 MED ORDER — SODIUM CHLORIDE 0.9 % IR SOLN
Status: DC | PRN
  Administered 2019-08-02: 1000 mL

## 2019-08-02 MED ORDER — PHENYLEPHRINE 40 MCG/ML (10ML) SYRINGE FOR IV PUSH (FOR BLOOD PRESSURE SUPPORT)
PREFILLED_SYRINGE | INTRAVENOUS | Status: AC
  Filled 2019-08-02: qty 10

## 2019-08-02 MED ORDER — LACTATED RINGERS IV SOLN: INTRAVENOUS | Status: DC

## 2019-08-02 MED ORDER — ENSURE SURGERY PO LIQD
237.0000 mL | Freq: Two times a day (BID) | ORAL | Status: DC
  Administered 2019-08-03 – 2019-08-04 (×3): 237 mL via ORAL
  Filled 2019-08-02 (×7): qty 237

## 2019-08-02 MED ORDER — METOPROLOL SUCCINATE ER 25 MG PO TB24
25.0000 mg | ORAL_TABLET | Freq: Every evening | ORAL | Status: DC
  Administered 2019-08-02 – 2019-08-04 (×3): 25 mg via ORAL
  Filled 2019-08-02 (×3): qty 1

## 2019-08-02 MED ORDER — OXYCODONE HCL 5 MG PO TABS: 5.0000 mg | ORAL_TABLET | Freq: Once | ORAL | Status: DC | PRN

## 2019-08-02 MED ORDER — FENTANYL CITRATE (PF) 100 MCG/2ML IJ SOLN
25.0000 ug | INTRAMUSCULAR | Status: DC | PRN
  Administered 2019-08-02: 50 ug via INTRAVENOUS

## 2019-08-02 MED ORDER — LIDOCAINE 2% (20 MG/ML) 5 ML SYRINGE
INTRAMUSCULAR | Status: DC | PRN
  Administered 2019-08-02: 1.5 mg/kg/h via INTRAVENOUS

## 2019-08-02 MED ORDER — SODIUM CHLORIDE 0.9 % IV SOLN
2.0000 g | Freq: Two times a day (BID) | INTRAVENOUS | Status: AC
  Administered 2019-08-02: 2 g via INTRAVENOUS
  Filled 2019-08-02: qty 2

## 2019-08-02 MED ORDER — BUPIVACAINE LIPOSOME 1.3 % IJ SUSP
INTRAMUSCULAR | Status: DC | PRN
  Administered 2019-08-02: 20 mL

## 2019-08-02 MED ORDER — INDOCYANINE GREEN 25 MG IV SOLR
INTRAVENOUS | Status: DC | PRN
  Administered 2019-08-02: 7.5 mg via INTRAVENOUS

## 2019-08-02 MED ORDER — ACETAMINOPHEN 500 MG PO TABS
1000.0000 mg | ORAL_TABLET | ORAL | Status: AC
  Administered 2019-08-02: 1000 mg via ORAL
  Filled 2019-08-02: qty 2

## 2019-08-02 MED ORDER — PHENYLEPHRINE 40 MCG/ML (10ML) SYRINGE FOR IV PUSH (FOR BLOOD PRESSURE SUPPORT)
PREFILLED_SYRINGE | INTRAVENOUS | Status: DC | PRN
  Administered 2019-08-02: 120 ug via INTRAVENOUS
  Administered 2019-08-02 (×3): 80 ug via INTRAVENOUS

## 2019-08-02 MED ORDER — FENTANYL CITRATE (PF) 100 MCG/2ML IJ SOLN
INTRAMUSCULAR | Status: AC
  Administered 2019-08-02: 50 ug via INTRAVENOUS
  Filled 2019-08-02: qty 2

## 2019-08-02 MED ORDER — FENTANYL CITRATE (PF) 100 MCG/2ML IJ SOLN
INTRAMUSCULAR | Status: DC | PRN
  Administered 2019-08-02: 100 ug via INTRAVENOUS
  Administered 2019-08-02 (×3): 50 ug via INTRAVENOUS

## 2019-08-02 MED ORDER — ALUM & MAG HYDROXIDE-SIMETH 200-200-20 MG/5ML PO SUSP: 30.0000 mL | Freq: Four times a day (QID) | ORAL | Status: DC | PRN

## 2019-08-02 MED ORDER — ROCURONIUM BROMIDE 10 MG/ML (PF) SYRINGE
PREFILLED_SYRINGE | INTRAVENOUS | Status: AC
  Filled 2019-08-02: qty 10

## 2019-08-02 MED ORDER — GABAPENTIN 300 MG PO CAPS
300.0000 mg | ORAL_CAPSULE | Freq: Two times a day (BID) | ORAL | Status: DC
  Administered 2019-08-02 – 2019-08-05 (×6): 300 mg via ORAL
  Filled 2019-08-02 (×6): qty 1

## 2019-08-02 MED ORDER — ACETAMINOPHEN 160 MG/5ML PO SOLN: 325.0000 mg | ORAL | Status: DC | PRN

## 2019-08-02 MED ORDER — FENTANYL CITRATE (PF) 250 MCG/5ML IJ SOLN
INTRAMUSCULAR | Status: AC
  Filled 2019-08-02: qty 5

## 2019-08-02 SURGICAL SUPPLY — 112 items
BAG URO CATCHER STRL LF (MISCELLANEOUS) ×3 IMPLANT
BLADE EXTENDED COATED 6.5IN (ELECTRODE) IMPLANT
CANNULA REDUC XI 12-8 STAPL (CANNULA) ×3
CANNULA REDUCER 12-8 DVNC XI (CANNULA) ×2 IMPLANT
CATH URET 5FR 28IN OPEN ENDED (CATHETERS) ×3 IMPLANT
CELLS DAT CNTRL 66122 CELL SVR (MISCELLANEOUS) IMPLANT
CLIP VESOLOCK LG 6/CT PURPLE (CLIP) IMPLANT
CLIP VESOLOCK MED 6/CT (CLIP) IMPLANT
CLOTH BEACON ORANGE TIMEOUT ST (SAFETY) ×3 IMPLANT
COVER SURGICAL LIGHT HANDLE (MISCELLANEOUS) ×6 IMPLANT
COVER TIP SHEARS 8 DVNC (MISCELLANEOUS) ×2 IMPLANT
COVER TIP SHEARS 8MM DA VINCI (MISCELLANEOUS) ×3
COVER WAND RF STERILE (DRAPES) ×3 IMPLANT
DECANTER SPIKE VIAL GLASS SM (MISCELLANEOUS) ×1 IMPLANT
DERMABOND ADVANCED (GAUZE/BANDAGES/DRESSINGS) ×1
DERMABOND ADVANCED .7 DNX12 (GAUZE/BANDAGES/DRESSINGS) IMPLANT
DRAIN CHANNEL 19F RND (DRAIN) IMPLANT
DRAPE ARM DVNC X/XI (DISPOSABLE) ×8 IMPLANT
DRAPE CARDIOVASC SPLIT 84X147 (DRAPES) ×1 IMPLANT
DRAPE CARDIOVASC SPLIT 88X140 (DRAPES) ×1 IMPLANT
DRAPE COLUMN DVNC XI (DISPOSABLE) ×2 IMPLANT
DRAPE DA VINCI XI ARM (DISPOSABLE) ×12
DRAPE DA VINCI XI COLUMN (DISPOSABLE) ×3
DRAPE SURG IRRIG POUCH 19X23 (DRAPES) ×3 IMPLANT
DRSG OPSITE POSTOP 4X10 (GAUZE/BANDAGES/DRESSINGS) IMPLANT
DRSG OPSITE POSTOP 4X6 (GAUZE/BANDAGES/DRESSINGS) ×1 IMPLANT
DRSG OPSITE POSTOP 4X8 (GAUZE/BANDAGES/DRESSINGS) IMPLANT
ELECT REM PT RETURN 15FT ADLT (MISCELLANEOUS) ×3 IMPLANT
ENDOLOOP SUT PDS II  0 18 (SUTURE)
ENDOLOOP SUT PDS II 0 18 (SUTURE) IMPLANT
EVACUATOR SILICONE 100CC (DRAIN) IMPLANT
GAUZE SPONGE 4X4 12PLY STRL (GAUZE/BANDAGES/DRESSINGS) IMPLANT
GLOVE BIO SURGEON STRL SZ 6.5 (GLOVE) ×11 IMPLANT
GLOVE BIO SURGEON STRL SZ7.5 (GLOVE) ×2 IMPLANT
GLOVE BIOGEL M STRL SZ7.5 (GLOVE) ×3 IMPLANT
GLOVE BIOGEL PI IND STRL 7.0 (GLOVE) ×6 IMPLANT
GLOVE BIOGEL PI INDICATOR 7.0 (GLOVE) ×5
GLOVE SURG SS PI 7.0 STRL IVOR (GLOVE) ×1 IMPLANT
GOWN STRL REUS W/TWL LRG LVL3 (GOWN DISPOSABLE) ×2 IMPLANT
GOWN STRL REUS W/TWL XL LVL3 (GOWN DISPOSABLE) ×21 IMPLANT
GRASPER ENDOPATH ANVIL 10MM (MISCELLANEOUS) IMPLANT
GRASPER SUT TROCAR 14GX15 (MISCELLANEOUS) IMPLANT
GUIDEWIRE STR DUAL SENSOR (WIRE) ×3 IMPLANT
HOLDER FOLEY CATH W/STRAP (MISCELLANEOUS) ×3 IMPLANT
IRRIG SUCT STRYKERFLOW 2 WTIP (MISCELLANEOUS) ×3
IRRIGATION SUCT STRKRFLW 2 WTP (MISCELLANEOUS) ×2 IMPLANT
IRRIGATOR SUCT 8 DISP DVNC XI (IRRIGATION / IRRIGATOR) IMPLANT
IRRIGATOR SUCTION 8MM XI DISP (IRRIGATION / IRRIGATOR)
KIT PROCEDURE DA VINCI SI (MISCELLANEOUS) ×3
KIT PROCEDURE DVNC SI (MISCELLANEOUS) IMPLANT
KIT SIGMOIDOSCOPE (SET/KITS/TRAYS/PACK) ×1 IMPLANT
KIT TURNOVER KIT A (KITS) IMPLANT
MANIFOLD NEPTUNE II (INSTRUMENTS) ×3 IMPLANT
NDL INSUFFLATION 14GA 120MM (NEEDLE) ×2 IMPLANT
NEEDLE INSUFFLATION 14GA 120MM (NEEDLE) ×3 IMPLANT
PACK COLON (CUSTOM PROCEDURE TRAY) ×3 IMPLANT
PACK CYSTO (CUSTOM PROCEDURE TRAY) ×3 IMPLANT
PENCIL SMOKE EVACUATOR (MISCELLANEOUS) IMPLANT
PORT LAP GEL ALEXIS MED 5-9CM (MISCELLANEOUS) ×3 IMPLANT
RELOAD STAPLE 45 BLU REG DVNC (STAPLE) IMPLANT
RELOAD STAPLE 45 GRN THCK DVNC (STAPLE) IMPLANT
RELOAD STAPLE 60 3.5 BLU DVNC (STAPLE) IMPLANT
RELOAD STAPLER 3.5X60 BLU DVNC (STAPLE) ×2 IMPLANT
RETRACTOR WND ALEXIS 18 MED (MISCELLANEOUS) IMPLANT
RTRCTR WOUND ALEXIS 18CM MED (MISCELLANEOUS)
SCISSORS LAP 5X35 DISP (ENDOMECHANICALS) ×3 IMPLANT
SEAL CANN UNIV 5-8 DVNC XI (MISCELLANEOUS) ×6 IMPLANT
SEAL XI 5MM-8MM UNIVERSAL (MISCELLANEOUS) ×12
SEALER VESSEL DA VINCI XI (MISCELLANEOUS) ×3
SEALER VESSEL EXT DVNC XI (MISCELLANEOUS) ×2 IMPLANT
SLEEVE ADV FIXATION 5X100MM (TROCAR) IMPLANT
SOLUTION ELECTROLUBE (MISCELLANEOUS) ×3 IMPLANT
STAPLER 45 BLU RELOAD XI (STAPLE) IMPLANT
STAPLER 45 BLUE RELOAD XI (STAPLE)
STAPLER 45 GREEN RELOAD XI (STAPLE)
STAPLER 45 GRN RELOAD XI (STAPLE) IMPLANT
STAPLER 60 DA VINCI SURE FORM (STAPLE) ×3
STAPLER 60 SUREFORM DVNC (STAPLE) IMPLANT
STAPLER CANNULA SEAL DVNC XI (STAPLE) ×2 IMPLANT
STAPLER CANNULA SEAL XI (STAPLE) ×3
STAPLER ECHELON POWER CIR 29 (STAPLE) ×1 IMPLANT
STAPLER RELOAD 3.5X60 BLU DVNC (STAPLE) ×2
STAPLER RELOAD 3.5X60 BLUE (STAPLE) ×3
STAPLER SHEATH (SHEATH)
STAPLER SHEATH ENDOWRIST DVNC (SHEATH) ×2 IMPLANT
STAPLER VISISTAT 35W (STAPLE) IMPLANT
STOPCOCK 4 WAY LG BORE MALE ST (IV SETS) ×6 IMPLANT
SUT ETHILON 2 0 PS N (SUTURE) IMPLANT
SUT NOVA NAB DX-16 0-1 5-0 T12 (SUTURE) ×6 IMPLANT
SUT PROLENE 2 0 KS (SUTURE) ×3 IMPLANT
SUT SILK 2 0 (SUTURE) ×3
SUT SILK 2 0 SH CR/8 (SUTURE) IMPLANT
SUT SILK 2-0 18XBRD TIE 12 (SUTURE) ×2 IMPLANT
SUT SILK 3 0 (SUTURE)
SUT SILK 3 0 SH CR/8 (SUTURE) ×3 IMPLANT
SUT SILK 3-0 18XBRD TIE 12 (SUTURE) IMPLANT
SUT V-LOC BARB 180 2/0GR6 GS22 (SUTURE)
SUT VIC AB 2-0 SH 18 (SUTURE) IMPLANT
SUT VIC AB 2-0 SH 27 (SUTURE) ×3
SUT VIC AB 2-0 SH 27X BRD (SUTURE) ×2 IMPLANT
SUT VIC AB 3-0 SH 18 (SUTURE) IMPLANT
SUT VIC AB 4-0 PS2 27 (SUTURE) ×6 IMPLANT
SUT VICRYL 0 UR6 27IN ABS (SUTURE) ×3 IMPLANT
SUTURE V-LC BRB 180 2/0GR6GS22 (SUTURE) IMPLANT
SYR 10ML ECCENTRIC (SYRINGE) ×3 IMPLANT
SYS LAPSCP GELPORT 120MM (MISCELLANEOUS)
SYSTEM LAPSCP GELPORT 120MM (MISCELLANEOUS) IMPLANT
TOWEL OR 17X26 10 PK STRL BLUE (TOWEL DISPOSABLE) IMPLANT
TOWEL OR NON WOVEN STRL DISP B (DISPOSABLE) ×3 IMPLANT
TROCAR ADV FIXATION 5X100MM (TROCAR) ×3 IMPLANT
TUBING CONNECTING 10 (TUBING) ×6 IMPLANT
TUBING INSUFFLATION 10FT LAP (TUBING) ×3 IMPLANT

## 2019-08-02 NOTE — Op Note (Signed)
Preoperative diagnosis:  1. Sigmoid diverticulitis  Postoperative diagnosis:  1. Same  Procedure: 1. Cystoscopy, ureteral injection of firefly contrast bilaterally  Surgeon: Ardis Hughs, MD  Anesthesia: General  Complications: None  Intraoperative findings:  #1: The patient's bladder was normal-appearing with orthotopic ureters and no bladder mucosal abnormalities.  EBL: Minimal  Specimens: None  Indication: [name] is a 64 y.o. patient with severely symptomatic diverticulitis.  As part of the operation Dr. Marcello Moores is requested that firefly contrast be injected into the patient's ureters bilaterally to help facilitate the dissection.  After reviewing the management options for treatment, he elected to proceed with the above surgical procedure(s). We have discussed the potential benefits and risks of the procedure, side effects of the proposed treatment, the likelihood of the patient achieving the goals of the procedure, and any potential problems that might occur during the procedure or recuperation. Informed consent has been obtained.  Description of procedure:  The patient was taken to the operating room and general anesthesia was induced.  The patient was placed in the dorsal lithotomy position, prepped and draped in the usual sterile fashion, and preoperative antibiotics were administered. A preoperative time-out was performed.   21 French 30 degree cystoscope was gently passed to the patient's urethra and into the bladder under visual guidance.  The bladder was subsequently emptied.  It was slowly filled up inspection entered 60 degree manner demonstrating no significant findings.  Then using a 5 [place] open-ended ureteral catheter I instilled 7.5 cc of firefly contrast into each ureter.  The bladder was subsequently emptied and a 16 French Foley catheter was placed.  Case was then turned back over to Dr. Marcello Moores for the Colon Resection.  Ardis Hughs, M.D.

## 2019-08-02 NOTE — Op Note (Signed)
08/02/2019  2:31 PM  PATIENT:  [name] [name]  64 y.o. female  Patient Care Team: Josetta Huddle, MD as PCP - General (Internal Medicine)  PRE-OPERATIVE DIAGNOSIS:  DIVERTICULAR DISEASE  POST-OPERATIVE DIAGNOSIS:  DIVERTICULAR DISEASE  PROCEDURE:  XI ROBOT ASSISTED SIGMOIDECTOMY, INTRAOPERATIVE ASSESSMENT OF PERFUSION RIGID PROCTOSCOPY  CYSTOSCOPY WITH FIREFLY INJECTION    Surgeon(s): Leighton Ruff, MD Ardis Hughs, MD Ralene Ok, MD  ASSISTANT: Dr Rosendo Gros   ANESTHESIA:   local and general  EBL: 25 ml Total I/O In: 2100 [I.V.:2000; IV Piggyback:100] Out: 43 [Urine:50; Blood:25]  Delay start of Pharmacological VTE agent (>24hrs) due to surgical blood loss or risk of bleeding:  no  DRAINS: none   SPECIMEN:  Source of Specimen:  Sigmoid colon  DISPOSITION OF SPECIMEN:  PATHOLOGY  COUNTS:  YES  PLAN OF CARE: Admit to inpatient   PATIENT DISPOSITION:  PACU - hemodynamically stable.  INDICATION:    64 y.o. 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  I recommended segmental resection:  The anatomy & physiology of the digestive tract was discussed.  The pathophysiology was discussed.  Natural history risks without surgery was discussed.   I worked to give an overview of the disease and the frequent need to have multispecialty involvement.  I feel the risks of no intervention will lead to serious problems that outweigh the operative risks; therefore, I recommended a partial colectomy to remove the pathology.  Laparoscopic & open techniques were discussed.   Risks such as bleeding, infection, abscess, leak, reoperation, possible ostomy, hernia, heart attack, death, and other risks were discussed.  I noted a good likelihood this will help address the problem.   Goals of post-operative recovery were discussed as well.    The patient expressed understanding & wished to proceed with surgery.  OR FINDINGS:   Patient had area of  inflammation in the proximal sigmoid  The anastomosis rests 12 cm from the anal verge by rigid proctoscopy.  Poor bowel prep  DESCRIPTION:   Informed consent was confirmed.  The patient underwent general anaesthesia without difficulty.  The patient was positioned appropriately.  VTE prevention in place.  The patient's abdomen was clipped, prepped, & draped in a sterile fashion.  Surgical timeout confirmed our plan.  The patient was positioned in reverse Trendelenburg.  Abdominal entry was gained using a Varies needle in the LUQ.  Entry was clean.  I induced carbon dioxide insufflation.  An 66mm robotic port was placed in the RUQ.  Camera inspection revealed no injury.  Extra ports were carefully placed under direct laparoscopic visualization.  I laparoscopically reflected the greater omentum and the upper abdomen the small bowel in the upper abdomen. The patient was appropriately positioned and the robot was docked to the patient's left side.  Instruments were placed under direct visualization.    I mobilized the sigmoid colon off of the pelvic sidewall.  I scored the base of peritoneum of the right side of the mesentery of the left colon from the ligament of Treitz to the peritoneal reflection of the mid rectum.  The patient had minimal chronic scarring but there was an area of inflammation at the pelvic brim within the proximal sigmoid colon.  I elevated the sigmoid mesentery and enetered into the retro-mesenteric plane. We were able to identify the left ureter and gonadal vessels. We kept those posterior within the retroperitoneum and elevated the left colon mesentery off that. I did isolated IMA pedicle but did not ligate it  yet.  I continued distally and got into the avascular plane posterior to the mesorectum. This allowed me to help mobilize the rectum as well by freeing the mesorectum off the sacrum.  I mobilized the peritoneal coverings towards the peritoneal reflection on both the right and left  sides of the rectum.  I could see the right and left ureters and stayed away from them.    I skeletonized the inferior mesenteric artery pedicle.  After confirming the left ureter was out of the way, I went ahead and ligated the inferior mesenteric artery pedicle with bipolar robotic vessel sealer well above its takeoff from the aorta.  We ensured hemostasis. I skeletonized the mesorectum at the junction at the proximal rectum using blunt dissection & bipolar robotic vessel sealer.  I mobilized the left colon in a lateral to medial fashion off the line of Toldt up towards the splenic flexure to ensure good mobilization of the left colon to reach into the pelvis.  Once everything was dissected, I did inject 3 mL of indocyanine green intravenously to assess for perfusion of the colon.  There was good perfusion of the descending colon down to the level of my dissection.  The rectum was well perfused.  At this point we a blue load robotic stapler into the suprapubic 12 mm port site.  I transected the rectosigmoid junction using a robotic stapler.  At this point the instruments were removed.  The robot was undocked.  I then enlarged the suprapubic port to a Pfannenstiel incision.  An [place] wound protector was placed.  The colon was removed from the abdomen.  A pursestring device was placed on the descending, sigmoid border.  A 2-0 Prolene pursestring suture was applied.  This was tied over a 29 mm EEA anvil.  This was then placed back into the abdomen and the cap was placed on the wound protector.  The abdomen was reinsufflated.  The EEA stapler was inserted into the rectal stump and the spike was brought out on the anterior border of the staple line.  An anastomosis was created.  There was no tension.  There was no leak when tested with insufflation under irrigation.  Hemostasis was good.  The anastomosis rest approximately 12 cm from the anal verge.  The abdomen was irrigated.  Hemostasis was good.  The robotic  ports were removed and we switched to clean gowns, gloves, instruments and drapes.  The peritoneal lining of the Pfannenstiel incision was closed using a running 2-0 Vicryl suture.  The fascia was closed using interrupted #1 Novafil sutures.  The subcutaneous tissue was reapproximated using interrupted 2-0 Vicryl sutures and the skin was closed using a running 4-0 Vicryl subcuticular suture.  A sterile dressing was applied.  The remaining port sites were closed with 4-0 Vicryl subcuticular sutures and Dermabond.  The patient was then awakened from anesthesia and sent to the postanesthesia care unit in stable condition.  All counts were correct per operating room staff.  An MD assistant was necessary for tissue manipulation, retraction and positioning due to the complexity of the case and hospital policies

## 2019-08-02 NOTE — Progress Notes (Signed)
Pt ambulated from stretcher to chair

## 2019-08-02 NOTE — Anesthesia Procedure Notes (Signed)
Procedure Name: Intubation Date/Time: 08/02/2019 12:26 PM Performed by: Montel Clock, CRNA Pre-anesthesia Checklist: Patient identified, Emergency Drugs available, Suction available, Patient being monitored and Timeout performed Patient Re-evaluated:Patient Re-evaluated prior to induction Oxygen Delivery Method: Circle system utilized Preoxygenation: Pre-oxygenation with 100% oxygen Induction Type: IV induction Ventilation: Mask ventilation without difficulty and Oral airway inserted - appropriate to patient size Laryngoscope Size: Mac and 3 Grade View: Grade I Tube type: Oral Tube size: 7.0 mm Number of attempts: 1 Airway Equipment and Method: Stylet Placement Confirmation: ETT inserted through vocal cords under direct vision,  positive ETCO2 and breath sounds checked- equal and bilateral Secured at: 21 cm Tube secured with: Tape Dental Injury: Teeth and Oropharynx as per pre-operative assessment  Comments: Grade 1 view with downward laryngeal pressure and MD holding head (pillow had poor position). ETT easily passed.

## 2019-08-02 NOTE — Anesthesia Postprocedure Evaluation (Signed)
Anesthesia Post Note  Patient: [name] [name]  Procedure(s) Performed: XI ROBOT ASSISTED LAPAROSCOPIC RECTOSIGMOIDECTOMY, RIGID PROCTOSCOPY (N/A Abdomen) CYSTOSCOPY WITH FIREFLY INJECTION (Bilateral Ureter)     Anesthesia Type: General    Last Vitals:  Vitals:   08/02/19 1500 08/02/19 1515  BP: 109/65   Pulse: 83 82  Resp: 11 14  Temp:    SpO2: 100% 97%    Last Pain:  Vitals:   08/02/19 1515  TempSrc:   PainSc: 5                  Malayah Demuro

## 2019-08-02 NOTE — Transfer of Care (Signed)
Immediate Anesthesia Transfer of Care Note  Patient: [name] [name]  Procedure(s) Performed: XI ROBOT ASSISTED LAPAROSCOPIC RECTOSIGMOIDECTOMY, RIGID PROCTOSCOPY (N/A Abdomen) CYSTOSCOPY WITH FIREFLY INJECTION (Bilateral Ureter)  Patient Location: PACU  Anesthesia Type:General  Level of Consciousness: drowsy and patient cooperative  Airway & Oxygen Therapy: Patient Spontanous Breathing and Patient connected to face mask oxygen  Post-op Assessment: Report given to RN and Post -op Vital signs reviewed and stable  Post vital signs: Reviewed and stable  Last Vitals:  Vitals Value Taken Time  BP 105/61 08/02/19 1449  Temp    Pulse 88 08/02/19 1452  Resp 11 08/02/19 1452  SpO2 100 % 08/02/19 1452  Vitals shown include unvalidated device data.  Last Pain:  Vitals:   08/02/19 1139  TempSrc:   PainSc: 2          Complications: No apparent anesthesia complications

## 2019-08-02 NOTE — H&P (Signed)
Cc: Sigmoid diverticulitis  HPI: 38F with sigmoid diverticulitis who presents for resection by DR. Thomas.    No current facility-administered medications on file prior to encounter.   Current Outpatient Medications on File Prior to Encounter  Medication Sig Dispense Refill  . acetaminophen (TYLENOL) 325 MG tablet Take 325-650 mg by mouth every 6 (six) hours as needed (headaches.).    Marland Kitchen Cholecalciferol (VITAMIN D3) 50 MCG (2000 UT) TABS Take 2,000 Units by mouth at bedtime.    . metoprolol succinate (TOPROL-XL) 50 MG 24 hr tablet Take 25 mg by mouth every evening.    . Probiotic Product (PROBIOTIC PO) Take 1 capsule by mouth at bedtime.    . Thiamine HCl (VITAMIN B-1 PO) Take 1 tablet by mouth at bedtime.    . Ascorbic Acid (VITAMIN C PO) Take 1 tablet by mouth at bedtime.    . SUMAtriptan (IMITREX) 100 MG tablet Take 100 mg by mouth every 2 (two) hours as needed for migraine. May repeat in 2 hours if headache persists or recurs.     Past Medical History:  Diagnosis Date  . Cancer ([place])    basal cell carcinoma scalp  . Chondromalacia of patella   . Dysrhythmia    Heart used to race at night  that is why she was put on metoprolol  . Headache(784.0)    migraine  . Plantar fasciitis   . Pneumonia    In college  . PONV (postoperative nausea and vomiting)    under impression it was  codeine   Past Surgical History:  Procedure Laterality Date  . BASAL CELL CARCINOMA EXCISION     on scalp  . TONSILLECTOMY       .NAD Today's Vitals   08/02/19 1127 08/02/19 1139  BP: 114/76   Pulse: (!) 103   Resp: 18   Temp: 98.3 F (36.8 C)   TempSrc: Oral   SpO2: 99%   Weight:  66.7 kg  Height:  5\' 3"  (1.6 m)  PainSc:  2    Body mass index is 26.04 kg/m. RRR CTA-B Abdomen soft Ext symmetric  No results for input(s): WBC, HGB, HCT in the last 72 hours. No results for input(s): NA, K, CL, CO2, GLUCOSE, BUN, CREATININE, CALCIUM in the last 72 hours. No results for input(s): LABPT,  INR in the last 72 hours. No results for input(s): PSA in the last 72 hours. No results for input(s): LABURIN in the last 72 hours. Results for orders placed or performed during the hospital encounter of 07/29/19  SARS CORONAVIRUS 2 (TAT 6-24 HRS) Nasopharyngeal Nasopharyngeal Swab     Status: None   Collection Time: 07/29/19  1:51 PM   Specimen: Nasopharyngeal Swab  Result Value Ref Range Status   SARS Coronavirus 2 NEGATIVE NEGATIVE Final    Comment: (NOTE) SARS-CoV-2 target nucleic acids are NOT DETECTED. The SARS-CoV-2 RNA is generally detectable in upper and lower respiratory specimens during the acute phase of infection. Negative results do not preclude SARS-CoV-2 infection, do not rule out co-infections with other pathogens, and should not be used as the sole basis for treatment or other patient management decisions. Negative results must be combined with clinical observations, patient history, and epidemiological information. The expected result is Negative. Fact Sheet for Patients: SugarRoll.be Fact Sheet for Healthcare Providers: https://www.woods-mathews.com/ This test is not yet approved or cleared by the [place] FDA and  has been authorized for detection and/or diagnosis of SARS-CoV-2 by FDA under an Emergency Use Authorization (EUA).  This EUA will remain  in effect (meaning this test can be used) for the duration of the COVID-19 declaration under Section 56 4(b)(1) of the Act, 21 U.S.C. section 360bbb-3(b)(1), unless the authorization is terminated or revoked sooner. Performed at [facility], [address] [place], [place] [address]     Image: I have independently reviewed her CT scan and discussed it with the patient.  Imp: Symptomatic divertiulitis scheduled for removal by Dr. Marcello Moores.  As part of the procedure she has requested that I inject FireFly contrast and place a temporary ureteral stent.  Plan:  Proceed as above.

## 2019-08-02 NOTE — H&P (Signed)
The patient is a 64 year old female who presents with diverticulitis. 64 year old female who presents to the office for evaluation of recurrent diverticulitis. She states that in June, she developed left lower quadrant pain and fevers. She was treated as an outpatient for diverticulitis. She developed another episode in July which was treated again with antibiotics. She then developed severe abdominal pain in late September 2020 and underwent a CT scan. This showed diverticulitis. Once again, and she was started on Cipro and Flagyl. Her symptoms have resolved at this point. F/U CT scan is normal. F/u colonoscopy showed no malignancy  Problem List/Past Medical  DIVERTICULITIS [id number])   Past Surgical History  Oral Surgery  Tonsillectomy   Diagnostic Studies History  Colonoscopy  1-5 years ago Mammogram  within last year Pap Smear  1-5 years ago  Allergies  Codeine/Codeine Derivatives  Nausea. Ampicillin *PENICILLINS*  Rash. Allergies Reconciled   Medication History   Metoprolol Succinate ER (50MG  Tablet ER 24HR, Oral) Active.  Multi-Vitamin (Oral) Active. Probiotic (Oral) Active.   Social History  Alcohol use  Occasional alcohol use. Caffeine use  Coffee. No drug use  Tobacco use  Never smoker.  Family History  Heart Disease  Father. Heart disease in female family member before age 5  Migraine Headache  Father. Prostate Cancer  Brother, Father.  Pregnancy / Birth History  Age at menarche  43 years. Age of menopause  35-50 Gravida  3 Length (months) of breastfeeding  >35 Maternal age  76-30 Para  2  Other Problems  Diverticulosis  Gastroesophageal Reflux Disease  Migraine Headache     Review of Systems  General Not Present- Appetite Loss, Chills, Fatigue, Fever, Night Sweats, Weight Gain and Weight Loss. Skin Not Present- Change in Wart/Mole, Dryness, Hives, Jaundice, New Lesions, Non-Healing Wounds, Rash and Ulcer. HEENT  Present- Wears glasses/contact lenses. Not Present- Earache, Hearing Loss, Hoarseness, Nose Bleed, Oral Ulcers, Ringing in the Ears, Seasonal Allergies, Sinus Pain, Sore Throat, Visual Disturbances and Yellow Eyes. Respiratory Not Present- Bloody sputum, Chronic Cough, Difficulty Breathing, Snoring and Wheezing. Breast Not Present- Breast Mass, Breast Pain, Nipple Discharge and Skin Changes. Cardiovascular Not Present- Chest Pain, Difficulty Breathing Lying Down, Leg Cramps, Palpitations, Rapid Heart Rate, Shortness of Breath and Swelling of Extremities. Gastrointestinal Present- Abdominal Pain, Change in Bowel Habits and Chronic diarrhea. Not Present- Bloating, Bloody Stool, Constipation, Difficulty Swallowing, Excessive gas, Gets full quickly at meals, Hemorrhoids, Indigestion, Nausea, Rectal Pain and Vomiting. Female Genitourinary Present- Frequency and Pelvic Pain. Not Present- Nocturia, Painful Urination and Urgency. Musculoskeletal Not Present- Back Pain, Joint Pain, Joint Stiffness, Muscle Pain, Muscle Weakness and Swelling of Extremities. Neurological Not Present- Decreased Memory, Fainting, Headaches, Numbness, Seizures, Tingling, Tremor, Trouble walking and Weakness. Psychiatric Present- Anxiety. Not Present- Bipolar, Change in Sleep Pattern, Depression, Fearful and Frequent crying. Endocrine Not Present- Cold Intolerance, Excessive Hunger, Hair Changes, Heat Intolerance, Hot flashes and New Diabetes. Hematology Present- Persistent Infections. Not Present- Blood Thinners, Easy Bruising, Excessive bleeding, Gland problems and HIV.  BP 114/76   Pulse (!) 103   Temp 98.3 F (36.8 C) (Oral)   Resp 18   Ht 5\' 3"  (1.6 m)   Wt 66.7 kg   SpO2 99%   BMI 26.04 kg/m      Physical Exam  General Mental Status-Alert. General Appearance-Cooperative, Not in acute distress. Build & Nutrition-Well nourished. Posture-Normal posture. Gait-Normal.  Head and Neck Head-normocephalic,  atraumatic with no lesions or palpable masses. Trachea-midline.  Chest and Lung Exam Chest and  lung exam reveals -on auscultation, normal breath sounds, no adventitious sounds and normal vocal resonance.  Cardiovascular Cardiovascular examination reveals -normal heart sounds, regular rate and rhythm with no murmurs and no digital clubbing, cyanosis, edema, increased warmth or tenderness.  Abdomen Inspection Inspection of the abdomen reveals - No Hernias. Palpation/Percussion Palpation and Percussion of the abdomen reveal - Soft, Non Tender(Mild tenderness to palpation suprapubic), No Rigidity (guarding), No hepatosplenomegaly and No Palpable abdominal masses.  Neurologic Neurologic evaluation reveals -alert and oriented x 3 with no impairment of recent or remote memory, normal attention span and ability to concentrate, normal sensation and normal coordination.  Musculoskeletal Normal Exam - Bilateral-Upper Extremity Strength Normal and Lower Extremity Strength Normal.    Assessment & Plan Leighton Ruff MD; [id number] 12:22 PM) DIVERTICULITIS [id number]) Impression: 64 year old female with recurrent episodes of diverticulitis who presents to the office to discuss surgery. Her last episode was several months ago. She underwent a colonoscopy at [facility] which showed no malignancy. I have recommended a robotic-assisted partial colectomy. Given the location of the diverticulitis, I have recommended preoperative ureteral injections to identify the ureters during surgery. We have discussed that her diarrhea may not improve after surgery. The surgery and anatomy were described to the patient as well as the risks of surgery and the possible complications. These include: Bleeding, deep abdominal infections and possible wound complications such as hernia and infection, damage to adjacent structures, leak of surgical connections, which can lead to other surgeries and possibly an ostomy, possible  need for other procedures, such as abscess drains in radiology, possible prolonged hospital stay, possible diarrhea from removal of part of the colon, possible constipation from narcotics, possible bowel, bladder or sexual dysfunction if having rectal surgery, prolonged fatigue/weakness or appetite loss, possible early recurrence of of disease, possible complications of their medical problems such as heart disease or arrhythmias or lung problems, death (less than 1%). I believe the patient understands and wishes to proceed with the surgery.

## 2019-08-02 NOTE — Anesthesia Preprocedure Evaluation (Signed)
Anesthesia Evaluation  Patient identified by MRN, date of birth, ID band Patient awake    Reviewed: Allergy & Precautions, H&P , NPO status , Patient's Chart, lab work & pertinent test results, reviewed documented beta blocker date and time   History of Anesthesia Complications (+) PONV and history of anesthetic complications  Airway Mallampati: II  TM Distance: >3 FB Neck ROM: full    Dental no notable dental hx.    Pulmonary neg pulmonary ROS,    Pulmonary exam normal breath sounds clear to auscultation       Cardiovascular Exercise Tolerance: Good Pt. on home beta blockers negative cardio ROS   Rhythm:regular Rate:Normal     Neuro/Psych  Headaches, negative psych ROS   GI/Hepatic negative GI ROS, Neg liver ROS,   Endo/Other  negative endocrine ROS  Renal/GU negative Renal ROS  negative genitourinary   Musculoskeletal   Abdominal   Peds  Hematology negative hematology ROS (+)   Anesthesia Other Findings   Reproductive/Obstetrics negative OB ROS                             Anesthesia Physical Anesthesia Plan  ASA: II  Anesthesia Plan: General   Post-op Pain Management:    Induction: Intravenous  PONV Risk Score and Plan: 4 or greater and Dexamethasone, Midazolam and Scopolamine patch - Pre-op  Airway Management Planned: Oral ETT  Additional Equipment:   Intra-op Plan:   Post-operative Plan: Extubation in OR  Informed Consent: I have reviewed the patients History and Physical, chart, labs and discussed the procedure including the risks, benefits and alternatives for the proposed anesthesia with the patient or authorized representative who has indicated his/her understanding and acceptance.     Dental Advisory Given  Plan Discussed with: CRNA, Anesthesiologist and Surgeon  Anesthesia Plan Comments: (  )        Anesthesia Quick Evaluation

## 2019-08-03 LAB — CBC
HCT: 37.8 % (ref 36.0–46.0)
Hemoglobin: 12.2 g/dL (ref 12.0–15.0)
MCH: 31.8 pg (ref 26.0–34.0)
MCHC: 32.3 g/dL (ref 30.0–36.0)
MCV: 98.4 fL (ref 80.0–100.0)
Platelets: 201 10*3/uL (ref 150–400)
RBC: 3.84 MIL/uL — ABNORMAL LOW (ref 3.87–5.11)
RDW: 11.7 % (ref 11.5–15.5)
WBC: 10.7 10*3/uL — ABNORMAL HIGH (ref 4.0–10.5)
nRBC: 0 % (ref 0.0–0.2)

## 2019-08-03 LAB — BASIC METABOLIC PANEL
Anion gap: 6 (ref 5–15)
BUN: 7 mg/dL — ABNORMAL LOW (ref 8–23)
CO2: 24 mmol/L (ref 22–32)
Calcium: 8.5 mg/dL — ABNORMAL LOW (ref 8.9–10.3)
Chloride: 104 mmol/L (ref 98–111)
Creatinine, Ser: 0.59 mg/dL (ref 0.44–1.00)
GFR calc Af Amer: >60 mL/min (ref >60)
GFR calc non Af Amer: >60 mL/min (ref >60)
Glucose, Bld: 145 mg/dL — ABNORMAL HIGH (ref 70–99)
Potassium: 4.2 mmol/L (ref 3.5–5.1)
Sodium: 134 mmol/L — ABNORMAL LOW (ref 135–145)

## 2019-08-03 MED ORDER — SUMATRIPTAN SUCCINATE 50 MG PO TABS
100.0000 mg | ORAL_TABLET | ORAL | Status: DC | PRN
  Filled 2019-08-03: qty 2

## 2019-08-03 MED ORDER — MAGIC MOUTHWASH
15.0000 mL | Freq: Four times a day (QID) | ORAL | Status: DC | PRN
  Filled 2019-08-03: qty 15

## 2019-08-03 MED ORDER — RISAQUAD PO CAPS
1.0000 | ORAL_CAPSULE | Freq: Every day | ORAL | Status: DC
  Administered 2019-08-03 – 2019-08-04 (×2): 1 via ORAL
  Filled 2019-08-03 (×2): qty 1

## 2019-08-03 MED ORDER — PHENOL 1.4 % MT LIQD
2.0000 | OROMUCOSAL | Status: DC | PRN
  Filled 2019-08-03: qty 177

## 2019-08-03 MED ORDER — LACTATED RINGERS IV BOLUS: 1000.0000 mL | Freq: Three times a day (TID) | INTRAVENOUS | Status: DC | PRN

## 2019-08-03 MED ORDER — LORAZEPAM 2 MG/ML IJ SOLN: 0.5000 mg | Freq: Three times a day (TID) | INTRAMUSCULAR | Status: DC | PRN

## 2019-08-03 MED ORDER — METHOCARBAMOL 500 MG PO TABS: 1000.0000 mg | ORAL_TABLET | Freq: Four times a day (QID) | ORAL | Status: DC | PRN

## 2019-08-03 MED ORDER — LIP MEDEX EX OINT
1.0000 "application " | TOPICAL_OINTMENT | Freq: Two times a day (BID) | CUTANEOUS | Status: DC
  Administered 2019-08-03 – 2019-08-05 (×4): 1 via TOPICAL
  Filled 2019-08-03: qty 7

## 2019-08-03 MED ORDER — ASCORBIC ACID 500 MG PO TABS
500.0000 mg | ORAL_TABLET | Freq: Every day | ORAL | Status: DC
  Administered 2019-08-03 – 2019-08-05 (×3): 500 mg via ORAL
  Filled 2019-08-03 (×3): qty 1

## 2019-08-03 MED ORDER — METHOCARBAMOL 1000 MG/10ML IJ SOLN
1000.0000 mg | Freq: Four times a day (QID) | INTRAVENOUS | Status: DC | PRN
  Filled 2019-08-03: qty 10

## 2019-08-03 MED ORDER — MENTHOL 3 MG MT LOZG: 1.0000 | LOZENGE | OROMUCOSAL | Status: DC | PRN

## 2019-08-03 MED ORDER — PROCHLORPERAZINE EDISYLATE 10 MG/2ML IJ SOLN: 5.0000 mg | INTRAMUSCULAR | Status: DC | PRN

## 2019-08-03 NOTE — Progress Notes (Signed)
1 Day Post-Op robotic sigmoidectomy  Subjective: Doing well.  Tolerating clears.  Pain controlled.  No nausea  Objective: Vital signs in last 24 hours: Temp:  [97.3 F (36.3 C)-99.5 F (37.5 C)] 99.5 F (37.5 C) (03/11 0508) Pulse Rate:  [72-103] 72 (03/11 0508) Resp:  [11-20] 20 (03/11 0508) BP: (98-121)/(61-76) 108/68 (03/11 0508) SpO2:  [97 %-100 %] 98 % (03/11 0508) Weight:  [66.7 kg] 66.7 kg (03/10 1139)   Intake/Output from previous day: 03/10 0701 - 03/11 0700 In: 4518.8 [P.O.:480; I.V.:3938.8; IV Piggyback:100] Out: 1985 [id number]; Blood:25] Intake/Output this shift: No intake/output data recorded.   General appearance: alert and cooperative GI: normal findings: soft, non-tender  Incision: no significant drainage  Lab Results:  Recent Labs    08/03/19 0402  WBC 10.7*  HGB 12.2  HCT 37.8  PLT 201   BMET Recent Labs    08/03/19 0402  NA 134*  K 4.2  CL 104  CO2 24  GLUCOSE 145*  BUN 7*  CREATININE 0.59  CALCIUM 8.5*   PT/INR No results for input(s): LABPROT, INR in the last 72 hours. ABG No results for input(s): PHART, HCO3 in the last 72 hours.  Invalid input(s): PCO2, PO2  MEDS, Scheduled . acetaminophen  1,000 mg Oral Q6H  . alvimopan  12 mg Oral BID  . enoxaparin (LOVENOX) injection  40 mg Subcutaneous Q24H  . feeding supplement  237 mL Oral BID BM  . gabapentin  300 mg Oral BID  . metoprolol succinate  25 mg Oral QPM  . saccharomyces boulardii  250 mg Oral BID    Studies/Results: No results found.  Assessment: s/p Procedure(s): XI ROBOT ASSISTED LAPAROSCOPIC RECTOSIGMOIDECTOMY, RIGID PROCTOSCOPY CYSTOSCOPY WITH FIREFLY INJECTION Patient Active Problem List   Diagnosis Date Noted  . Diverticular disease 08/02/2019  . Migraine without aura, without mention of intractable migraine without mention of status migrainosus 12/26/2013  . Exertional headache 12/26/2013  . Knee effusion, right 06/15/2012    Expected post op  course  Plan: d/c foley Advance diet Ambulate  SL IVF's   LOS: 1 day     .Rosario Adie, MD [facility], [place]    08/03/2019 7:22 AM

## 2019-08-03 NOTE — Progress Notes (Signed)
Patient having mushy stools, brown with visible blood. Informed her that this is to be expected.  She is also incontinent of stool and states "I'm upset that nobody told me it would be like this."  Gave patient mesh panties and pads. Spouse went out and bought patient adult diapers.  She is "very concerned" and requested to speak to the surgeon. Dr Johney Maine paged and reinforced information I had told patient. I relayed everything he said to patient and spouse. Patient still appears angry that she "was not prepared for this". Apologized profusely. Offered patient ativan that Dr Johney Maine ordered.  Patient refused. Donne Hazel, RN

## 2019-08-04 LAB — CBC
HCT: 34.8 % — ABNORMAL LOW (ref 36.0–46.0)
Hemoglobin: 11.3 g/dL — ABNORMAL LOW (ref 12.0–15.0)
MCH: 31.2 pg (ref 26.0–34.0)
MCHC: 32.5 g/dL (ref 30.0–36.0)
MCV: 96.1 fL (ref 80.0–100.0)
Platelets: 179 10*3/uL (ref 150–400)
RBC: 3.62 MIL/uL — ABNORMAL LOW (ref 3.87–5.11)
RDW: 11.9 % (ref 11.5–15.5)
WBC: 5.2 10*3/uL (ref 4.0–10.5)
nRBC: 0 % (ref 0.0–0.2)

## 2019-08-04 LAB — SURGICAL PATHOLOGY

## 2019-08-04 LAB — BASIC METABOLIC PANEL
Anion gap: 8 (ref 5–15)
BUN: 8 mg/dL (ref 8–23)
CO2: 25 mmol/L (ref 22–32)
Calcium: 8.6 mg/dL — ABNORMAL LOW (ref 8.9–10.3)
Chloride: 106 mmol/L (ref 98–111)
Creatinine, Ser: 0.64 mg/dL (ref 0.44–1.00)
GFR calc Af Amer: >60 mL/min (ref >60)
GFR calc non Af Amer: >60 mL/min (ref >60)
Glucose, Bld: 90 mg/dL (ref 70–99)
Potassium: 5.1 mmol/L (ref 3.5–5.1)
Sodium: 139 mmol/L (ref 135–145)

## 2019-08-04 MED ORDER — TRAMADOL HCL 50 MG PO TABS: 50.0000 mg | ORAL_TABLET | Freq: Four times a day (QID) | ORAL | 0 refills | Status: DC | PRN

## 2019-08-04 NOTE — Progress Notes (Signed)
Pharmacy Brief Note - Alvimopan (Entereg)  The standing order set for alvimopan (Entereg) now includes an automatic order to discontinue the drug after the patient has had a bowel movement. The change was approved by the [place] and the Medical Executive Committee.   This patient has had bowel movements documented by nursing. Therefore, alvimopan has been discontinued. If there are questions, please contact the pharmacy at [phone].   Thank you- Dolly Rias RPh 08/04/2019, 10:50 AM

## 2019-08-04 NOTE — Progress Notes (Signed)
2 Days Post-Op robotic sigmoidectomy  Subjective: Doing well.  Tolerating diet.  Had some bloody BM's yesterday.  Pain controlled.  No nausea  Objective: Vital signs in last 24 hours: Temp:  [97.9 F (36.6 C)-99 F (37.2 C)] 98.3 F (36.8 C) (03/12 0607) Pulse Rate:  [63-72] 66 (03/12 0607) Resp:  [16-18] 16 (03/12 0607) BP: (98-116)/(52-77) 116/77 (03/12 0607) SpO2:  [97 %-100 %] 97 % (03/12 0607)   Intake/Output from previous day: 03/11 0701 - 03/12 0700 In: 240 [P.O.:240] Out: 2200 [Urine:2200] Intake/Output this shift: No intake/output data recorded.   General appearance: alert and cooperative GI: normal findings: soft, non-tender  Incision: no significant drainage  Lab Results:  Recent Labs    08/03/19 0402 08/04/19 0410  WBC 10.7* 5.2  HGB 12.2 11.3*  HCT 37.8 34.8*  PLT 201 179   BMET Recent Labs    08/03/19 0402 08/04/19 0410  NA 134* 139  K 4.2 5.1  CL 104 106  CO2 24 25  GLUCOSE 145* 90  BUN 7* 8  CREATININE 0.59 0.64  CALCIUM 8.5* 8.6*   PT/INR No results for input(s): LABPROT, INR in the last 72 hours. ABG No results for input(s): PHART, HCO3 in the last 72 hours.  Invalid input(s): PCO2, PO2  MEDS, Scheduled . acetaminophen  1,000 mg Oral Q6H  . acidophilus  1 capsule Oral QHS  . alvimopan  12 mg Oral BID  . vitamin C  500 mg Oral Daily  . enoxaparin (LOVENOX) injection  40 mg Subcutaneous Q24H  . feeding supplement  237 mL Oral BID BM  . gabapentin  300 mg Oral BID  . lip balm  1 application Topical BID  . metoprolol succinate  25 mg Oral QPM  . saccharomyces boulardii  250 mg Oral BID    Studies/Results: No results found.  Assessment: s/p Procedure(s): XI ROBOT ASSISTED LAPAROSCOPIC RECTOSIGMOIDECTOMY, RIGID PROCTOSCOPY CYSTOSCOPY WITH FIREFLY INJECTION Patient Active Problem List   Diagnosis Date Noted  . Diverticular disease 08/02/2019  . Migraine without aura, without mention of intractable migraine without mention  of status migrainosus 12/26/2013  . Exertional headache 12/26/2013  . Knee effusion, right 06/15/2012    Expected post op course.  Rectal bleeding has subsided per pt.  Hgb ok today   Plan: Cont diet Ambulate  Recheck labs in AM   LOS: 2 days     .Rosario Adie, MD [facility], [place]    08/04/2019 7:24 AM

## 2019-08-04 NOTE — Discharge Instructions (Signed)

## 2019-08-05 LAB — BASIC METABOLIC PANEL WITH GFR
Anion gap: 7 (ref 5–15)
BUN: 11 mg/dL (ref 8–23)
CO2: 29 mmol/L (ref 22–32)
Calcium: 9 mg/dL (ref 8.9–10.3)
Chloride: 104 mmol/L (ref 98–111)
Creatinine, Ser: 0.77 mg/dL (ref 0.44–1.00)
GFR calc Af Amer: >60 mL/min
GFR calc non Af Amer: >60 mL/min
Glucose, Bld: 91 mg/dL (ref 70–99)
Potassium: 4 mmol/L (ref 3.5–5.1)
Sodium: 140 mmol/L (ref 135–145)

## 2019-08-05 LAB — CBC
HCT: 36.8 % (ref 36.0–46.0)
Hemoglobin: 12.2 g/dL (ref 12.0–15.0)
MCH: 31.7 pg (ref 26.0–34.0)
MCHC: 33.2 g/dL (ref 30.0–36.0)
MCV: 95.6 fL (ref 80.0–100.0)
Platelets: 187 10*3/uL (ref 150–400)
RBC: 3.85 MIL/uL — ABNORMAL LOW (ref 3.87–5.11)
RDW: 11.9 % (ref 11.5–15.5)
WBC: 4.1 10*3/uL (ref 4.0–10.5)
nRBC: 0 % (ref 0.0–0.2)

## 2019-08-05 NOTE — Progress Notes (Signed)
Patient ID: [name] [name], female   DOB: May 10, 1956, 64 y.o.   MRN: [id number]   Feeling much better today. No gross bleeding per rectum. She is tolerating a diet and pain is well controlled  Plan: Discharge home

## 2019-08-05 NOTE — Progress Notes (Signed)
Nurse reviewed discharge instructions with pt.  Pt verbalized understanding of discharge instructions, follow up appointments and new medications.  No concerns at time of discharge. 

## 2019-08-05 NOTE — Discharge Summary (Signed)
Physician Discharge Summary  Patient ID: [name] [name] MRN: [id number] DOB/AGE: 1955-05-30 64 y.o.  Admit date: 08/02/2019 Discharge date: 08/05/2019  Admission Diagnoses:  Discharge Diagnoses:  Active Problems:   Diverticular disease   Discharged Condition: good  Hospital Course: Uneventful postoperative recovery.  Discharged on postoperative day 3 tolerating a diet and having bowel movements  Consults: None  Significant Diagnostic Studies:   Treatments: surgery: Robot-assisted rectosigmoidectomy and proctoscopy  Discharge Exam: Blood pressure 114/70, pulse 68, temperature 98 F (36.7 C), temperature source Oral, resp. rate 14, height 5\' 3"  (1.6 m), weight 69.7 kg, SpO2 95 %. General appearance: alert, cooperative and no distress  Lungs clear Abdomen soft, minimally tender, incisions clean  Disposition: Discharge disposition: 01-Home or Self Care        Allergies as of 08/05/2019      Reactions   Ampicillin Rash   Did it involve swelling of the face/tongue/throat, SOB, or low BP? Unknown Did it involve sudden or severe rash/hives, skin peeling, or any reaction on the inside of your mouth or nose? Yes Did you need to seek medical attention at a hospital or doctor's office? No When did it last happen?More than 10 years ago (during pt. college years) If all above answers are "NO", may proceed with cephalosporin use.   Adhesive [tape]    Irritation to adhesive ?   Codeine Nausea Only, Other (See Comments)   dizziness      Medication List    TAKE these medications   acetaminophen 325 MG tablet Commonly known as: TYLENOL Take 325-650 mg by mouth every 6 (six) hours as needed (headaches.).   metoprolol succinate 50 MG 24 hr tablet Commonly known as: TOPROL-XL Take 25 mg by mouth every evening.   PROBIOTIC PO Take 1 capsule by mouth at bedtime.   SUMAtriptan 100 MG tablet Commonly known as: IMITREX Take 100 mg by mouth every 2 (two) hours as needed for  migraine. May repeat in 2 hours if headache persists or recurs.   traMADol 50 MG tablet Commonly known as: ULTRAM Take 1 tablet (50 mg total) by mouth every 6 (six) hours as needed (mild pain).   VITAMIN C PO Take 1 tablet by mouth at bedtime.   Vitamin D3 50 MCG (2000 UT) Tabs Take 2,000 Units by mouth at bedtime.      Follow-up Information    Leighton Ruff, MD. Schedule an appointment as soon as possible for a visit in 2 week(s).   Specialty: General Surgery Contact information: [place] [address] [phone]           Signed: Coralie Keens 08/05/2019, 7:48 AM

## 2020-03-03 DIAGNOSIS — Z20822 Contact with and (suspected) exposure to covid-19: Secondary | ICD-10-CM | POA: Diagnosis not present

## 2020-03-21 DIAGNOSIS — Z872 Personal history of diseases of the skin and subcutaneous tissue: Secondary | ICD-10-CM | POA: Diagnosis not present

## 2020-03-21 DIAGNOSIS — Z85828 Personal history of other malignant neoplasm of skin: Secondary | ICD-10-CM | POA: Diagnosis not present

## 2020-03-21 DIAGNOSIS — D225 Melanocytic nevi of trunk: Secondary | ICD-10-CM | POA: Diagnosis not present

## 2020-03-21 DIAGNOSIS — L814 Other melanin hyperpigmentation: Secondary | ICD-10-CM | POA: Diagnosis not present

## 2020-03-21 DIAGNOSIS — L821 Other seborrheic keratosis: Secondary | ICD-10-CM | POA: Diagnosis not present

## 2020-03-21 DIAGNOSIS — D1801 Hemangioma of skin and subcutaneous tissue: Secondary | ICD-10-CM | POA: Diagnosis not present

## 2020-03-21 DIAGNOSIS — L905 Scar conditions and fibrosis of skin: Secondary | ICD-10-CM | POA: Diagnosis not present

## 2020-03-21 DIAGNOSIS — L308 Other specified dermatitis: Secondary | ICD-10-CM | POA: Diagnosis not present

## 2020-04-10 DIAGNOSIS — Z0001 Encounter for general adult medical examination with abnormal findings: Secondary | ICD-10-CM | POA: Diagnosis not present

## 2020-04-10 DIAGNOSIS — R21 Rash and other nonspecific skin eruption: Secondary | ICD-10-CM | POA: Diagnosis not present

## 2020-04-10 DIAGNOSIS — Z23 Encounter for immunization: Secondary | ICD-10-CM | POA: Diagnosis not present

## 2020-04-10 DIAGNOSIS — E559 Vitamin D deficiency, unspecified: Secondary | ICD-10-CM | POA: Diagnosis not present

## 2020-04-10 DIAGNOSIS — E538 Deficiency of other specified B group vitamins: Secondary | ICD-10-CM | POA: Diagnosis not present

## 2020-04-30 DIAGNOSIS — D485 Neoplasm of uncertain behavior of skin: Secondary | ICD-10-CM | POA: Diagnosis not present

## 2020-04-30 DIAGNOSIS — L308 Other specified dermatitis: Secondary | ICD-10-CM | POA: Diagnosis not present

## 2020-04-30 DIAGNOSIS — L728 Other follicular cysts of the skin and subcutaneous tissue: Secondary | ICD-10-CM | POA: Diagnosis not present

## 2020-05-08 DIAGNOSIS — N760 Acute vaginitis: Secondary | ICD-10-CM | POA: Diagnosis not present

## 2020-05-27 DIAGNOSIS — N762 Acute vulvitis: Secondary | ICD-10-CM | POA: Diagnosis not present

## 2020-05-27 DIAGNOSIS — Z131 Encounter for screening for diabetes mellitus: Secondary | ICD-10-CM | POA: Diagnosis not present

## 2020-07-02 DIAGNOSIS — Z124 Encounter for screening for malignant neoplasm of cervix: Secondary | ICD-10-CM | POA: Diagnosis not present

## 2020-07-02 DIAGNOSIS — Z1231 Encounter for screening mammogram for malignant neoplasm of breast: Secondary | ICD-10-CM | POA: Diagnosis not present

## 2020-07-02 DIAGNOSIS — Z6823 Body mass index (BMI) 23.0-23.9, adult: Secondary | ICD-10-CM | POA: Diagnosis not present

## 2020-08-30 DIAGNOSIS — M7501 Adhesive capsulitis of right shoulder: Secondary | ICD-10-CM | POA: Diagnosis not present

## 2020-10-01 DIAGNOSIS — M7501 Adhesive capsulitis of right shoulder: Secondary | ICD-10-CM | POA: Diagnosis not present

## 2020-10-09 DIAGNOSIS — M7501 Adhesive capsulitis of right shoulder: Secondary | ICD-10-CM | POA: Diagnosis not present

## 2020-10-11 DIAGNOSIS — M67911 Unspecified disorder of synovium and tendon, right shoulder: Secondary | ICD-10-CM | POA: Diagnosis not present

## 2020-10-11 DIAGNOSIS — M7501 Adhesive capsulitis of right shoulder: Secondary | ICD-10-CM | POA: Diagnosis not present

## 2020-10-22 DIAGNOSIS — M7501 Adhesive capsulitis of right shoulder: Secondary | ICD-10-CM | POA: Diagnosis not present

## 2020-10-22 DIAGNOSIS — M25511 Pain in right shoulder: Secondary | ICD-10-CM | POA: Diagnosis not present

## 2020-10-25 DIAGNOSIS — M7501 Adhesive capsulitis of right shoulder: Secondary | ICD-10-CM | POA: Diagnosis not present

## 2020-10-29 DIAGNOSIS — M7501 Adhesive capsulitis of right shoulder: Secondary | ICD-10-CM | POA: Diagnosis not present

## 2020-10-30 DIAGNOSIS — M7501 Adhesive capsulitis of right shoulder: Secondary | ICD-10-CM | POA: Diagnosis not present

## 2020-10-31 DIAGNOSIS — M7501 Adhesive capsulitis of right shoulder: Secondary | ICD-10-CM | POA: Diagnosis not present

## 2021-02-17 DIAGNOSIS — M25562 Pain in left knee: Secondary | ICD-10-CM | POA: Diagnosis not present

## 2021-02-17 DIAGNOSIS — M7989 Other specified soft tissue disorders: Secondary | ICD-10-CM | POA: Diagnosis not present

## 2021-02-18 ENCOUNTER — Ambulatory Visit
Admission: RE | Admit: 2021-02-18 | Discharge: 2021-02-18 | Disposition: A | Discharge status: Home / Self Care | Payer: PPO | Source: Ambulatory Visit | Attending: Internal Medicine | Admitting: Internal Medicine

## 2021-02-18 ENCOUNTER — Other Ambulatory Visit: Payer: Self-pay | Admitting: Internal Medicine

## 2021-02-18 DIAGNOSIS — R52 Pain, unspecified: Secondary | ICD-10-CM

## 2021-02-18 DIAGNOSIS — M25562 Pain in left knee: Secondary | ICD-10-CM | POA: Diagnosis not present

## 2021-02-18 DIAGNOSIS — M7989 Other specified soft tissue disorders: Secondary | ICD-10-CM | POA: Diagnosis not present

## 2021-02-19 DIAGNOSIS — M25562 Pain in left knee: Secondary | ICD-10-CM | POA: Diagnosis not present

## 2021-03-05 DIAGNOSIS — M7122 Synovial cyst of popliteal space [Baker], left knee: Secondary | ICD-10-CM | POA: Diagnosis not present

## 2021-03-05 DIAGNOSIS — M1712 Unilateral primary osteoarthritis, left knee: Secondary | ICD-10-CM | POA: Diagnosis not present

## 2021-03-25 DIAGNOSIS — L821 Other seborrheic keratosis: Secondary | ICD-10-CM | POA: Diagnosis not present

## 2021-03-25 DIAGNOSIS — Z85828 Personal history of other malignant neoplasm of skin: Secondary | ICD-10-CM | POA: Diagnosis not present

## 2021-03-25 DIAGNOSIS — Z08 Encounter for follow-up examination after completed treatment for malignant neoplasm: Secondary | ICD-10-CM | POA: Diagnosis not present

## 2021-03-25 DIAGNOSIS — L218 Other seborrheic dermatitis: Secondary | ICD-10-CM | POA: Diagnosis not present

## 2021-03-25 DIAGNOSIS — L814 Other melanin hyperpigmentation: Secondary | ICD-10-CM | POA: Diagnosis not present

## 2021-03-25 DIAGNOSIS — D225 Melanocytic nevi of trunk: Secondary | ICD-10-CM | POA: Diagnosis not present

## 2021-03-25 DIAGNOSIS — L720 Epidermal cyst: Secondary | ICD-10-CM | POA: Diagnosis not present

## 2021-03-25 DIAGNOSIS — Z872 Personal history of diseases of the skin and subcutaneous tissue: Secondary | ICD-10-CM | POA: Diagnosis not present

## 2021-05-21 DIAGNOSIS — Z0001 Encounter for general adult medical examination with abnormal findings: Secondary | ICD-10-CM | POA: Diagnosis not present

## 2021-05-21 DIAGNOSIS — E7800 Pure hypercholesterolemia, unspecified: Secondary | ICD-10-CM | POA: Diagnosis not present

## 2021-05-21 DIAGNOSIS — Z713 Dietary counseling and surveillance: Secondary | ICD-10-CM | POA: Diagnosis not present

## 2021-05-21 DIAGNOSIS — Z23 Encounter for immunization: Secondary | ICD-10-CM | POA: Diagnosis not present

## 2021-05-21 DIAGNOSIS — E538 Deficiency of other specified B group vitamins: Secondary | ICD-10-CM | POA: Diagnosis not present

## 2021-05-21 DIAGNOSIS — M25511 Pain in right shoulder: Secondary | ICD-10-CM | POA: Diagnosis not present

## 2021-05-21 DIAGNOSIS — M25562 Pain in left knee: Secondary | ICD-10-CM | POA: Diagnosis not present

## 2021-05-21 DIAGNOSIS — E559 Vitamin D deficiency, unspecified: Secondary | ICD-10-CM | POA: Diagnosis not present

## 2021-07-25 ENCOUNTER — Ambulatory Visit
Admission: RE | Admit: 2021-07-25 | Discharge: 2021-07-25 | Disposition: A | Discharge status: Home / Self Care | Payer: PPO | Source: Ambulatory Visit | Attending: Internal Medicine | Admitting: Internal Medicine

## 2021-07-25 ENCOUNTER — Other Ambulatory Visit: Payer: Self-pay | Admitting: Internal Medicine

## 2021-07-25 DIAGNOSIS — K5792 Diverticulitis of intestine, part unspecified, without perforation or abscess without bleeding: Secondary | ICD-10-CM | POA: Diagnosis not present

## 2021-07-25 DIAGNOSIS — R109 Unspecified abdominal pain: Secondary | ICD-10-CM

## 2021-07-25 DIAGNOSIS — K5732 Diverticulitis of large intestine without perforation or abscess without bleeding: Secondary | ICD-10-CM | POA: Diagnosis not present

## 2021-07-25 DIAGNOSIS — R1012 Left upper quadrant pain: Secondary | ICD-10-CM | POA: Diagnosis not present

## 2021-07-25 MED ORDER — IOPAMIDOL (ISOVUE-300) INJECTION 61%
100.0000 mL | Freq: Once | INTRAVENOUS | Status: AC | PRN
  Administered 2021-07-25: 100 mL via INTRAVENOUS

## 2021-07-29 DIAGNOSIS — K5792 Diverticulitis of intestine, part unspecified, without perforation or abscess without bleeding: Secondary | ICD-10-CM | POA: Diagnosis not present

## 2021-08-06 DIAGNOSIS — K5792 Diverticulitis of intestine, part unspecified, without perforation or abscess without bleeding: Secondary | ICD-10-CM | POA: Diagnosis not present

## 2021-09-22 DIAGNOSIS — Z1151 Encounter for screening for human papillomavirus (HPV): Secondary | ICD-10-CM | POA: Diagnosis not present

## 2021-09-22 DIAGNOSIS — Z1231 Encounter for screening mammogram for malignant neoplasm of breast: Secondary | ICD-10-CM | POA: Diagnosis not present

## 2021-09-22 DIAGNOSIS — Z124 Encounter for screening for malignant neoplasm of cervix: Secondary | ICD-10-CM | POA: Diagnosis not present

## 2021-09-22 DIAGNOSIS — N958 Other specified menopausal and perimenopausal disorders: Secondary | ICD-10-CM | POA: Diagnosis not present

## 2021-09-22 DIAGNOSIS — M8588 Other specified disorders of bone density and structure, other site: Secondary | ICD-10-CM | POA: Diagnosis not present

## 2021-09-22 DIAGNOSIS — Z6824 Body mass index (BMI) 24.0-24.9, adult: Secondary | ICD-10-CM | POA: Diagnosis not present

## 2022-06-22 DIAGNOSIS — K5732 Diverticulitis of large intestine without perforation or abscess without bleeding: Secondary | ICD-10-CM | POA: Diagnosis not present

## 2022-06-22 DIAGNOSIS — Z8719 Personal history of other diseases of the digestive system: Secondary | ICD-10-CM | POA: Diagnosis not present

## 2022-06-22 DIAGNOSIS — Z09 Encounter for follow-up examination after completed treatment for conditions other than malignant neoplasm: Secondary | ICD-10-CM | POA: Diagnosis not present

## 2022-07-01 DIAGNOSIS — E7800 Pure hypercholesterolemia, unspecified: Secondary | ICD-10-CM | POA: Diagnosis not present

## 2022-07-01 DIAGNOSIS — M8580 Other specified disorders of bone density and structure, unspecified site: Secondary | ICD-10-CM | POA: Diagnosis not present

## 2022-07-01 DIAGNOSIS — G43909 Migraine, unspecified, not intractable, without status migrainosus: Secondary | ICD-10-CM | POA: Diagnosis not present

## 2022-07-01 DIAGNOSIS — E559 Vitamin D deficiency, unspecified: Secondary | ICD-10-CM | POA: Diagnosis not present

## 2022-07-01 DIAGNOSIS — Z79899 Other long term (current) drug therapy: Secondary | ICD-10-CM | POA: Diagnosis not present

## 2022-07-01 DIAGNOSIS — Z1331 Encounter for screening for depression: Secondary | ICD-10-CM | POA: Diagnosis not present

## 2022-07-01 DIAGNOSIS — Z0000 Encounter for general adult medical examination without abnormal findings: Secondary | ICD-10-CM | POA: Diagnosis not present

## 2022-07-01 DIAGNOSIS — I479 Paroxysmal tachycardia, unspecified: Secondary | ICD-10-CM | POA: Diagnosis not present

## 2022-09-29 DIAGNOSIS — Z6825 Body mass index (BMI) 25.0-25.9, adult: Secondary | ICD-10-CM | POA: Diagnosis not present

## 2022-09-29 DIAGNOSIS — Z1231 Encounter for screening mammogram for malignant neoplasm of breast: Secondary | ICD-10-CM | POA: Diagnosis not present

## 2022-09-29 DIAGNOSIS — Z124 Encounter for screening for malignant neoplasm of cervix: Secondary | ICD-10-CM | POA: Diagnosis not present

## 2022-09-30 ENCOUNTER — Other Ambulatory Visit (HOSPITAL_BASED_OUTPATIENT_CLINIC_OR_DEPARTMENT_OTHER): Payer: Self-pay | Admitting: Obstetrics and Gynecology

## 2022-09-30 DIAGNOSIS — Z8249 Family history of ischemic heart disease and other diseases of the circulatory system: Secondary | ICD-10-CM

## 2022-10-08 ENCOUNTER — Ambulatory Visit (HOSPITAL_COMMUNITY): Payer: PPO

## 2022-10-27 ENCOUNTER — Ambulatory Visit (HOSPITAL_COMMUNITY): Payer: PPO

## 2022-10-27 ENCOUNTER — Encounter (HOSPITAL_COMMUNITY): Payer: Self-pay

## 2022-11-12 ENCOUNTER — Ambulatory Visit (HOSPITAL_COMMUNITY)
Admission: RE | Admit: 2022-11-12 | Discharge: 2022-11-12 | Disposition: A | Discharge status: Home / Self Care | Payer: PPO | Source: Ambulatory Visit | Attending: Obstetrics and Gynecology | Admitting: Obstetrics and Gynecology

## 2022-11-12 DIAGNOSIS — Z8249 Family history of ischemic heart disease and other diseases of the circulatory system: Secondary | ICD-10-CM | POA: Insufficient documentation

## 2023-02-02 NOTE — Progress Notes (Unsigned)
Synopsis: Referred in September 2024 for pulmonary nodule by Richardean Chimera, MD  Subjective:   PATIENT ID: [name] [name]: female DOB: May 24, 1956, MRN: [id number]  No chief complaint on file.   This is a 67 year old female, past medical history of basal cell carcinoma of the scalp, headache, pneumonia in college many years ago.  Paternal aunt with breast cancer.  Patient is a lifelong non-smoker.  She was referred for abnormal cardiac scoring CT.  This was completed in June 2024.  She is found to have a 6 mm subpleural right middle lobe pulmonary nodule.  With possible internal early calcification.  Looks like it represents a granuloma but relatively nonspecific.  Patient was referred here for evaluation and next steps.     Past Medical History:  Diagnosis Date   Cancer (HCC)    basal cell carcinoma scalp   Chondromalacia of patella    Dysrhythmia    Heart used to race at night  that is why she was put on metoprolol   Headache(784.0)    migraine   Plantar fasciitis    Pneumonia    In college   PONV (postoperative nausea and vomiting)    under impression it was  codeine     Family History  Problem Relation Age of Onset   Cancer Father    Breast cancer Paternal Aunt      Past Surgical History:  Procedure Laterality Date   BASAL CELL CARCINOMA EXCISION     on scalp   CYSTOSCOPY WITH STENT PLACEMENT Bilateral 08/02/2019   Procedure: CYSTOSCOPY WITH FIREFLY INJECTION;  Surgeon: Crist Fat, MD;  Location: [facility];  Service: Urology;  Laterality: Bilateral;   TONSILLECTOMY      Social History   Socioeconomic History   Marital status: Married    Spouse name: Not on file   Number of children: 2   Years of education: MA   Highest education level: Not on file  Occupational History   Occupation: [employer]    Employer: [employer]  Tobacco Use   Smoking status: Never   Smokeless tobacco: Never  Vaping Use   Vaping status: Never Used  Substance and  Sexual Activity   Alcohol use: No   Drug use: No   Sexual activity: Yes    Birth control/protection: None  Other Topics Concern   Not on file  Social History Narrative   Patient lives at home with her family   Patient is right handed   Patient drink coffee daily   Social Determinants of Health   Financial Resource Strain: Not on file  Food Insecurity: Not on file  Transportation Needs: Not on file  Physical Activity: Not on file  Stress: Not on file  Social Connections: Unknown (10/07/2021)   Received from [facility]   Social Network    Social Network: Not on file  Intimate Partner Violence: Unknown (08/29/2021)   Received from [facility]   HITS    Physically Hurt: Not on file    Insult or Talk Down To: Not on file    Threaten Physical Harm: Not on file    Scream or Curse: Not on file     Allergies  Allergen Reactions   Ampicillin Rash    Did it involve swelling of the face/tongue/throat, SOB, or low BP? Unknown Did it involve sudden or severe rash/hives, skin peeling, or any reaction on the inside of your mouth or nose? Yes Did you need to seek medical attention  at a hospital or doctor's office? No When did it last happen?More than 10 years ago (during pt. college years)   If all above answers are "NO", may proceed with cephalosporin use.    Adhesive [Tape]     Irritation to adhesive ?   Codeine Nausea Only and Other (See Comments)    dizziness     Outpatient Medications Prior to Visit  Medication Sig Dispense Refill   acetaminophen (TYLENOL) 325 MG tablet Take 325-650 mg by mouth every 6 (six) hours as needed (headaches.).     Ascorbic Acid (VITAMIN C PO) Take 1 tablet by mouth at bedtime.     Cholecalciferol (VITAMIN D3) 50 MCG (2000 UT) TABS Take 2,000 Units by mouth at bedtime.     metoprolol succinate (TOPROL-XL) 50 MG 24 hr tablet Take 25 mg by mouth every evening.     Probiotic Product (PROBIOTIC PO) Take 1 capsule by mouth at bedtime.     SUMAtriptan  (IMITREX) 100 MG tablet Take 100 mg by mouth every 2 (two) hours as needed for migraine. May repeat in 2 hours if headache persists or recurs.     traMADol (ULTRAM) 50 MG tablet Take 1 tablet (50 mg total) by mouth every 6 (six) hours as needed (mild pain). 15 tablet 0   No facility-administered medications prior to visit.    ROS   Objective:  Physical Exam   There were no vitals filed for this visit.   on *** LPM *** RA BMI Readings from Last 3 Encounters:  08/05/19 27.21 kg/m  07/26/19 26.08 kg/m  07/26/19 26.22 kg/m   Wt Readings from Last 3 Encounters:  08/05/19 153 lb 9.6 oz (69.7 kg)  07/26/19 147 lb 4 oz (66.8 kg)  07/26/19 148 lb (67.1 kg)     CBC    Component Value Date/Time   WBC 4.1 08/05/2019 0512   RBC 3.85 (L) 08/05/2019 0512   HGB 12.2 08/05/2019 0512   HCT 36.8 08/05/2019 0512   PLT 187 08/05/2019 0512   MCV 95.6 08/05/2019 0512   MCH 31.7 08/05/2019 0512   MCHC 33.2 08/05/2019 0512   RDW 11.9 08/05/2019 0512    ***  Chest Imaging: ***  Pulmonary Functions Testing Results:     No data to display          FeNO: ***  Pathology: ***  Echocardiogram: ***  Heart Catheterization: ***    Assessment & Plan:     ICD-10-CM   1. Right middle lobe pulmonary nodule  R91.1       Discussion: ***   Current Outpatient Medications:    acetaminophen (TYLENOL) 325 MG tablet, Take 325-650 mg by mouth every 6 (six) hours as needed (headaches.)., Disp: , Rfl:    Ascorbic Acid (VITAMIN C PO), Take 1 tablet by mouth at bedtime., Disp: , Rfl:    Cholecalciferol (VITAMIN D3) 50 MCG (2000 UT) TABS, Take 2,000 Units by mouth at bedtime., Disp: , Rfl:    metoprolol succinate (TOPROL-XL) 50 MG 24 hr tablet, Take 25 mg by mouth every evening., Disp: , Rfl:    Probiotic Product (PROBIOTIC PO), Take 1 capsule by mouth at bedtime., Disp: , Rfl:    SUMAtriptan (IMITREX) 100 MG tablet, Take 100 mg by mouth every 2 (two) hours as needed for migraine. May  repeat in 2 hours if headache persists or recurs., Disp: , Rfl:    traMADol (ULTRAM) 50 MG tablet, Take 1 tablet (50 mg total) by mouth every 6 (  six) hours as needed (mild pain)., Disp: 15 tablet, Rfl: 0  I spent *** minutes dedicated to the care of this patient on the date of this encounter to include pre-visit review of records, face-to-face time with the patient discussing conditions above, post visit ordering of testing, clinical documentation with the electronic health record, making appropriate referrals as documented, and communicating necessary findings to members of the patients care team.   Josephine Igo, DO [facility] 02/02/2023 4:43 PM

## 2023-02-03 ENCOUNTER — Ambulatory Visit: Payer: PPO | Admitting: Pulmonary Disease

## 2023-02-03 ENCOUNTER — Encounter: Payer: Self-pay | Admitting: Pulmonary Disease

## 2023-02-03 VITALS — BP 110/66 | HR 60 | Ht 64.0 in | Wt 146.6 lb

## 2023-02-03 DIAGNOSIS — R911 Solitary pulmonary nodule: Secondary | ICD-10-CM

## 2023-02-03 NOTE — Patient Instructions (Signed)
Thank you for visiting Dr. Tonia Brooms at [facility]. Today we recommend the following:  Orders Placed This Encounter  Procedures   CT Chest Wo Contrast   Return in about 9 months (around 11/03/2023) for with APP or Dr. Tonia Brooms, after CT Chest.    Please do your part to reduce the spread of COVID-19.

## 2023-07-14 ENCOUNTER — Ambulatory Visit (HOSPITAL_COMMUNITY): Payer: PPO

## 2023-07-14 ENCOUNTER — Other Ambulatory Visit (HOSPITAL_COMMUNITY): Payer: Self-pay | Admitting: Internal Medicine

## 2023-07-14 DIAGNOSIS — R1012 Left upper quadrant pain: Secondary | ICD-10-CM

## 2023-07-15 ENCOUNTER — Ambulatory Visit (HOSPITAL_COMMUNITY)
Admission: RE | Admit: 2023-07-15 | Discharge: 2023-07-15 | Disposition: A | Discharge status: Home / Self Care | Payer: PPO | Source: Ambulatory Visit | Attending: Internal Medicine | Admitting: Internal Medicine

## 2023-07-15 DIAGNOSIS — R1012 Left upper quadrant pain: Secondary | ICD-10-CM | POA: Insufficient documentation

## 2023-07-15 MED ORDER — IOHEXOL 300 MG/ML  SOLN
100.0000 mL | Freq: Once | INTRAMUSCULAR | Status: AC | PRN
  Administered 2023-07-15: 100 mL via INTRAVENOUS

## 2023-07-15 MED ORDER — IOHEXOL 300 MG/ML  SOLN
30.0000 mL | Freq: Once | INTRAMUSCULAR | Status: AC | PRN
  Administered 2023-07-15: 30 mL via ORAL

## 2023-07-16 ENCOUNTER — Ambulatory Visit (HOSPITAL_COMMUNITY): Payer: PPO

## 2023-08-04 DIAGNOSIS — R1012 Left upper quadrant pain: Secondary | ICD-10-CM | POA: Diagnosis not present

## 2023-08-04 DIAGNOSIS — K5792 Diverticulitis of intestine, part unspecified, without perforation or abscess without bleeding: Secondary | ICD-10-CM | POA: Diagnosis not present

## 2023-08-31 DIAGNOSIS — R1012 Left upper quadrant pain: Secondary | ICD-10-CM | POA: Diagnosis not present

## 2023-08-31 DIAGNOSIS — K5732 Diverticulitis of large intestine without perforation or abscess without bleeding: Secondary | ICD-10-CM | POA: Diagnosis not present

## 2023-08-31 DIAGNOSIS — K5730 Diverticulosis of large intestine without perforation or abscess without bleeding: Secondary | ICD-10-CM | POA: Diagnosis not present

## 2023-08-31 DIAGNOSIS — Z980 Intestinal bypass and anastomosis status: Secondary | ICD-10-CM | POA: Diagnosis not present

## 2023-09-30 DIAGNOSIS — Z124 Encounter for screening for malignant neoplasm of cervix: Secondary | ICD-10-CM | POA: Diagnosis not present

## 2023-09-30 DIAGNOSIS — Z1231 Encounter for screening mammogram for malignant neoplasm of breast: Secondary | ICD-10-CM | POA: Diagnosis not present

## 2023-09-30 DIAGNOSIS — Z6826 Body mass index (BMI) 26.0-26.9, adult: Secondary | ICD-10-CM | POA: Diagnosis not present

## 2023-11-01 ENCOUNTER — Encounter: Payer: Self-pay | Admitting: Adult Health

## 2023-11-01 ENCOUNTER — Ambulatory Visit: Payer: PPO | Admitting: Adult Health

## 2023-11-01 VITALS — BP 110/70 | HR 56 | Ht 63.5 in | Wt 150.8 lb

## 2023-11-01 DIAGNOSIS — R911 Solitary pulmonary nodule: Secondary | ICD-10-CM | POA: Diagnosis not present

## 2023-11-01 DIAGNOSIS — R918 Other nonspecific abnormal finding of lung field: Secondary | ICD-10-CM

## 2023-11-01 NOTE — Assessment & Plan Note (Signed)
 Cardiac CT chest 10/2022 with incidental finding in never smoker -6mm RML. Serial CT chest pending. Will call/message about results. If no change most likely will not need additional imaging in never smoker.   Plan  . Patient Instructions  Set up for CT chest -follow Right middle lobe nodule.  I will be in touch with results  Follow up in 1 year with Dr. Baldwin Levee  or Jernard Reiber NP and As needed

## 2023-11-01 NOTE — Patient Instructions (Addendum)
 Set up for CT chest -follow Right middle lobe nodule.  I will be in touch with results  Follow up in 1 year with Dr. Baldwin Levee  or Concettina Leth NP and As needed

## 2023-11-01 NOTE — Progress Notes (Signed)
 @Patient  ID: [name] [name], female    DOB: 1956-04-24, 68 y.o.   MRN: [id number]  Chief Complaint  Patient presents with   Follow-up    Referring provider: Tena Feeling, MD  HPI: 68 year old female never smoker seen for pulmonary consult February 03, 2023 for lung nodule right middle lobe  TEST/EVENTS :  10/2022 Cardiac CT chest -6 mm RML nodule  11/01/2023 Follow up : Lung nodule  Patient returns for a follow up visit. Last seen September 2024 for pulmonary consult for lung nodule noted on Cardiac CT chest. She is a never smoker. Recommended for a 1 year serial CT chest which is pending.  She denies dyspnea, weight loss, hemoptysis . No history of Asthma. Walks 4 miles daily.   Allergies  Allergen Reactions   Ampicillin Rash    Did it involve swelling of the face/tongue/throat, SOB, or low BP? Unknown Did it involve sudden or severe rash/hives, skin peeling, or any reaction on the inside of your mouth or nose? Yes Did you need to seek medical attention at a hospital or doctor's office? No When did it last happen?More than 10 years ago (during pt. college years)   If all above answers are "NO", may proceed with cephalosporin use.    Adhesive [Tape]     Irritation to adhesive ?   Codeine Nausea Only and Other (See Comments)    dizziness    Immunization History  Administered Date(s) Administered   Influenza,inj,Quad PF,6+ Mos 03/13/2019    Past Medical History:  Diagnosis Date   Cancer (HCC)    basal cell carcinoma scalp   Chondromalacia of patella    Dysrhythmia    Heart used to race at night  that is why she was put on metoprolol    Headache(784.0)    migraine   Plantar fasciitis    Pneumonia    In college   PONV (postoperative nausea and vomiting)    under impression it was  codeine    Tobacco History: Social History   Tobacco Use  Smoking Status Never  Smokeless Tobacco Never   Counseling given: Not Answered   Outpatient Medications Prior to Visit   Medication Sig Dispense Refill   acetaminophen  (TYLENOL ) 325 MG tablet Take 325-650 mg by mouth every 6 (six) hours as needed (headaches.).     Ascorbic Acid  (VITAMIN C PO) Take 1 tablet by mouth at bedtime.     Cholecalciferol (VITAMIN D3) 50 MCG (2000 UT) TABS Take 2,000 Units by mouth at bedtime.     metoprolol  succinate (TOPROL -XL) 50 MG 24 hr tablet Take 25 mg by mouth every evening.     Probiotic Product (PROBIOTIC PO) Take 1 capsule by mouth at bedtime.     SUMAtriptan  (IMITREX ) 100 MG tablet Take 100 mg by mouth every 2 (two) hours as needed for migraine. May repeat in 2 hours if headache persists or recurs.     No facility-administered medications prior to visit.     Review of Systems:   Constitutional:   No  weight loss, night sweats,  Fevers, chills, fatigue, or  lassitude.  HEENT:   No headaches,  Difficulty swallowing,  Tooth/dental problems, or  Sore throat,                No sneezing, itching, ear ache, nasal congestion, post nasal drip,   CV:  No chest pain,  Orthopnea, PND, swelling in lower extremities, anasarca, dizziness, palpitations, syncope.   GI  No heartburn, indigestion, abdominal  pain, nausea, vomiting, diarrhea, change in bowel habits, loss of appetite, bloody stools.   Resp: No shortness of breath with exertion or at rest.  No excess mucus, no productive cough,  No non-productive cough,  No coughing up of blood.  No change in color of mucus.  No wheezing.  No chest wall deformity  Skin: no rash or lesions.  GU: no dysuria, change in color of urine, no urgency or frequency.  No flank pain, no hematuria   MS:  No joint pain or swelling.  No decreased range of motion.  No back pain.    Physical Exam  BP 110/70 (BP Location: Left Arm, Patient Position: Sitting, Cuff Size: Normal)   Pulse (!) 56   Ht 5' 3.5" (1.613 m)   Wt 150 lb 12.8 oz (68.4 kg)   SpO2 100%   BMI 26.29 kg/m   GEN: A/Ox3; pleasant , NAD, well nourished    HEENT:  [place]/AT,    NOSE-clear, THROAT-clear, no lesions, no postnasal drip or exudate noted.   NECK:  Supple w/ fair ROM; no JVD; normal carotid impulses w/o bruits; no thyromegaly or nodules palpated; no lymphadenopathy.    RESP  Clear  P & A; w/o, wheezes/ rales/ or rhonchi. no accessory muscle use, no dullness to percussion  CARD:  RRR, no m/r/g, no peripheral edema, pulses intact, no cyanosis or clubbing.  GI:   Soft & nt; nml bowel sounds; no organomegaly or masses detected.   Musco: Warm bil, no deformities or joint swelling noted.   Neuro: alert, no focal deficits noted.    Skin: Warm, no lesions or rashes    Lab Results:    BMET   BNP No results found for: "BNP"  ProBNP No results found for: "PROBNP"  Imaging: No results found.  Administration History     None           No data to display          No results found for: "NITRICOXIDE"      Assessment & Plan:   Lung nodule Cardiac CT chest 10/2022 with incidental finding in never smoker -6mm RML. Serial CT chest pending. Will call/message about results. If no change most likely will not need additional imaging in never smoker.   Plan  . Patient Instructions  Set up for CT chest -follow Right middle lobe nodule.  I will be in touch with results  Follow up in 1 year with Dr. Baldwin Levee  or [name] Laconte NP and As needed          Roena Clark, NP 11/01/2023

## 2023-11-04 DIAGNOSIS — L821 Other seborrheic keratosis: Secondary | ICD-10-CM | POA: Diagnosis not present

## 2023-11-04 DIAGNOSIS — Z85828 Personal history of other malignant neoplasm of skin: Secondary | ICD-10-CM | POA: Diagnosis not present

## 2023-11-04 DIAGNOSIS — Z08 Encounter for follow-up examination after completed treatment for malignant neoplasm: Secondary | ICD-10-CM | POA: Diagnosis not present

## 2023-11-04 DIAGNOSIS — D225 Melanocytic nevi of trunk: Secondary | ICD-10-CM | POA: Diagnosis not present

## 2023-11-04 DIAGNOSIS — Z872 Personal history of diseases of the skin and subcutaneous tissue: Secondary | ICD-10-CM | POA: Diagnosis not present

## 2023-11-04 DIAGNOSIS — L814 Other melanin hyperpigmentation: Secondary | ICD-10-CM | POA: Diagnosis not present

## 2023-11-16 ENCOUNTER — Encounter (HOSPITAL_COMMUNITY): Payer: Self-pay

## 2023-11-16 ENCOUNTER — Ambulatory Visit (HOSPITAL_COMMUNITY)
Admission: RE | Admit: 2023-11-16 | Discharge: 2023-11-16 | Disposition: A | Discharge status: Home / Self Care | Source: Ambulatory Visit | Attending: Adult Health | Admitting: Adult Health

## 2023-11-16 DIAGNOSIS — R918 Other nonspecific abnormal finding of lung field: Secondary | ICD-10-CM | POA: Diagnosis not present

## 2023-11-16 DIAGNOSIS — I700 Atherosclerosis of aorta: Secondary | ICD-10-CM | POA: Diagnosis not present

## 2023-11-16 DIAGNOSIS — J479 Bronchiectasis, uncomplicated: Secondary | ICD-10-CM | POA: Diagnosis not present

## 2023-11-30 ENCOUNTER — Ambulatory Visit: Payer: Self-pay | Admitting: Adult Health
